# Patient Record
Sex: Female | Born: 1965 | Race: Black or African American | Hispanic: No | Marital: Single | Smoking: Never smoker
Health system: Southern US, Community
[De-identification: ages and names within clinical notes are randomized; demographics above are authoritative.]

## PROBLEM LIST (undated history)

## (undated) DIAGNOSIS — I2510 Atherosclerotic heart disease of native coronary artery without angina pectoris: Secondary | ICD-10-CM

## (undated) DIAGNOSIS — E7800 Pure hypercholesterolemia, unspecified: Secondary | ICD-10-CM

## (undated) DIAGNOSIS — H9190 Unspecified hearing loss, unspecified ear: Secondary | ICD-10-CM

## (undated) DIAGNOSIS — E079 Disorder of thyroid, unspecified: Secondary | ICD-10-CM

## (undated) DIAGNOSIS — M1990 Unspecified osteoarthritis, unspecified site: Secondary | ICD-10-CM

## (undated) DIAGNOSIS — K819 Cholecystitis, unspecified: Secondary | ICD-10-CM

## (undated) DIAGNOSIS — K2281 Esophageal polyp: Secondary | ICD-10-CM

## (undated) DIAGNOSIS — N83209 Unspecified ovarian cyst, unspecified side: Secondary | ICD-10-CM

## (undated) DIAGNOSIS — K228 Other specified diseases of esophagus: Secondary | ICD-10-CM

## (undated) DIAGNOSIS — C801 Malignant (primary) neoplasm, unspecified: Secondary | ICD-10-CM

## (undated) DIAGNOSIS — Q2730 Arteriovenous malformation, site unspecified: Secondary | ICD-10-CM

## (undated) DIAGNOSIS — D219 Benign neoplasm of connective and other soft tissue, unspecified: Secondary | ICD-10-CM

## (undated) DIAGNOSIS — F952 Tourette's disorder: Secondary | ICD-10-CM

## (undated) DIAGNOSIS — K219 Gastro-esophageal reflux disease without esophagitis: Secondary | ICD-10-CM

## (undated) DIAGNOSIS — I959 Hypotension, unspecified: Secondary | ICD-10-CM

## (undated) DIAGNOSIS — I498 Other specified cardiac arrhythmias: Secondary | ICD-10-CM

## (undated) DIAGNOSIS — H547 Unspecified visual loss: Secondary | ICD-10-CM

## (undated) HISTORY — PX: BREAST SURGERY: SHX581

---

## 2000-09-20 ENCOUNTER — Encounter: Payer: Self-pay | Admitting: Emergency Medicine

## 2000-09-20 ENCOUNTER — Emergency Department (HOSPITAL_COMMUNITY): Admission: EM | Admit: 2000-09-20 | Discharge: 2000-09-20 | Payer: Self-pay | Admitting: Emergency Medicine

## 2000-09-21 ENCOUNTER — Encounter: Payer: Self-pay | Admitting: Emergency Medicine

## 2000-09-21 ENCOUNTER — Emergency Department (HOSPITAL_COMMUNITY): Admission: EM | Admit: 2000-09-21 | Discharge: 2000-09-21 | Payer: Self-pay | Admitting: Emergency Medicine

## 2000-09-22 ENCOUNTER — Encounter: Payer: Self-pay | Admitting: Emergency Medicine

## 2001-06-01 ENCOUNTER — Other Ambulatory Visit: Admission: RE | Admit: 2001-06-01 | Discharge: 2001-06-01 | Payer: Self-pay | Admitting: Obstetrics and Gynecology

## 2002-02-12 ENCOUNTER — Emergency Department (HOSPITAL_COMMUNITY): Admission: EM | Admit: 2002-02-12 | Discharge: 2002-02-12 | Payer: Self-pay | Admitting: Emergency Medicine

## 2002-02-13 ENCOUNTER — Encounter: Payer: Self-pay | Admitting: Emergency Medicine

## 2002-04-30 ENCOUNTER — Emergency Department (HOSPITAL_COMMUNITY): Admission: EM | Admit: 2002-04-30 | Discharge: 2002-04-30 | Payer: Self-pay | Admitting: *Deleted

## 2002-06-15 ENCOUNTER — Other Ambulatory Visit: Admission: RE | Admit: 2002-06-15 | Discharge: 2002-06-15 | Payer: Self-pay | Admitting: Obstetrics and Gynecology

## 2003-06-10 ENCOUNTER — Encounter: Admission: RE | Admit: 2003-06-10 | Discharge: 2003-06-10 | Payer: Self-pay | Admitting: Obstetrics and Gynecology

## 2003-08-07 ENCOUNTER — Emergency Department (HOSPITAL_COMMUNITY): Admission: EM | Admit: 2003-08-07 | Discharge: 2003-08-07 | Payer: Self-pay | Admitting: Emergency Medicine

## 2003-11-22 ENCOUNTER — Other Ambulatory Visit: Admission: RE | Admit: 2003-11-22 | Discharge: 2003-11-22 | Payer: Self-pay | Admitting: Obstetrics and Gynecology

## 2004-05-01 ENCOUNTER — Emergency Department (HOSPITAL_COMMUNITY): Admission: EM | Admit: 2004-05-01 | Discharge: 2004-05-01 | Payer: Self-pay | Admitting: Emergency Medicine

## 2004-09-07 ENCOUNTER — Emergency Department (HOSPITAL_COMMUNITY): Admission: EM | Admit: 2004-09-07 | Discharge: 2004-09-07 | Payer: Self-pay | Admitting: Emergency Medicine

## 2005-01-08 ENCOUNTER — Other Ambulatory Visit: Admission: RE | Admit: 2005-01-08 | Discharge: 2005-01-08 | Payer: Self-pay | Admitting: Obstetrics and Gynecology

## 2005-06-29 ENCOUNTER — Emergency Department (HOSPITAL_COMMUNITY): Admission: EM | Admit: 2005-06-29 | Discharge: 2005-06-29 | Payer: Self-pay | Admitting: Emergency Medicine

## 2005-11-03 ENCOUNTER — Encounter: Admission: RE | Admit: 2005-11-03 | Discharge: 2005-11-03 | Payer: Self-pay | Admitting: Internal Medicine

## 2013-02-25 ENCOUNTER — Emergency Department (HOSPITAL_COMMUNITY): Payer: No Typology Code available for payment source

## 2013-02-25 ENCOUNTER — Encounter (HOSPITAL_COMMUNITY): Payer: Self-pay | Admitting: Emergency Medicine

## 2013-02-25 ENCOUNTER — Emergency Department (HOSPITAL_COMMUNITY)
Admission: EM | Admit: 2013-02-25 | Discharge: 2013-02-25 | Disposition: A | Discharge status: Home / Self Care | Payer: No Typology Code available for payment source | Attending: Emergency Medicine | Admitting: Emergency Medicine

## 2013-02-25 DIAGNOSIS — S161XXA Strain of muscle, fascia and tendon at neck level, initial encounter: Secondary | ICD-10-CM

## 2013-02-25 DIAGNOSIS — Z79899 Other long term (current) drug therapy: Secondary | ICD-10-CM | POA: Insufficient documentation

## 2013-02-25 DIAGNOSIS — Y9389 Activity, other specified: Secondary | ICD-10-CM | POA: Insufficient documentation

## 2013-02-25 DIAGNOSIS — Q279 Congenital malformation of peripheral vascular system, unspecified: Secondary | ICD-10-CM | POA: Insufficient documentation

## 2013-02-25 DIAGNOSIS — Z8742 Personal history of other diseases of the female genital tract: Secondary | ICD-10-CM | POA: Insufficient documentation

## 2013-02-25 DIAGNOSIS — Z8739 Personal history of other diseases of the musculoskeletal system and connective tissue: Secondary | ICD-10-CM | POA: Insufficient documentation

## 2013-02-25 DIAGNOSIS — Z8719 Personal history of other diseases of the digestive system: Secondary | ICD-10-CM | POA: Insufficient documentation

## 2013-02-25 DIAGNOSIS — Y92410 Unspecified street and highway as the place of occurrence of the external cause: Secondary | ICD-10-CM | POA: Insufficient documentation

## 2013-02-25 DIAGNOSIS — I2510 Atherosclerotic heart disease of native coronary artery without angina pectoris: Secondary | ICD-10-CM | POA: Insufficient documentation

## 2013-02-25 DIAGNOSIS — S139XXA Sprain of joints and ligaments of unspecified parts of neck, initial encounter: Secondary | ICD-10-CM | POA: Insufficient documentation

## 2013-02-25 DIAGNOSIS — E079 Disorder of thyroid, unspecified: Secondary | ICD-10-CM | POA: Insufficient documentation

## 2013-02-25 DIAGNOSIS — S0990XA Unspecified injury of head, initial encounter: Secondary | ICD-10-CM | POA: Insufficient documentation

## 2013-02-25 HISTORY — DX: Other specified cardiac arrhythmias: I49.8

## 2013-02-25 HISTORY — DX: Disorder of thyroid, unspecified: E07.9

## 2013-02-25 HISTORY — DX: Arteriovenous malformation, site unspecified: Q27.30

## 2013-02-25 HISTORY — DX: Unspecified ovarian cyst, unspecified side: N83.209

## 2013-02-25 HISTORY — DX: Other specified diseases of esophagus: K22.8

## 2013-02-25 HISTORY — DX: Benign neoplasm of connective and other soft tissue, unspecified: D21.9

## 2013-02-25 HISTORY — DX: Esophageal polyp: K22.81

## 2013-02-25 HISTORY — DX: Gastro-esophageal reflux disease without esophagitis: K21.9

## 2013-02-25 HISTORY — DX: Pure hypercholesterolemia, unspecified: E78.00

## 2013-02-25 HISTORY — DX: Hypotension, unspecified: I95.9

## 2013-02-25 HISTORY — DX: Unspecified osteoarthritis, unspecified site: M19.90

## 2013-02-25 HISTORY — DX: Atherosclerotic heart disease of native coronary artery without angina pectoris: I25.10

## 2013-02-25 MED ORDER — KETOROLAC TROMETHAMINE 60 MG/2ML IM SOLN
60.0000 mg | Freq: Once | INTRAMUSCULAR | Status: AC
  Administered 2013-02-25: 60 mg via INTRAMUSCULAR
  Filled 2013-02-25: qty 2

## 2013-02-25 MED ORDER — METHOCARBAMOL 500 MG PO TABS: 500.0000 mg | ORAL_TABLET | Freq: Two times a day (BID) | ORAL | Status: DC
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
## 2013-02-25 NOTE — ED Notes (Signed)
Called on xray to check delay
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
## 2013-02-27 NOTE — ED Provider Notes (Signed)
Medical screening examination/treatment/procedure(s) were performed by non-physician practitioner and as supervising physician I was immediately available for consultation/collaboration.  EKG Interpretation   None         Richardean Canal, MD 02/27/13 1444

## 2013-04-16 ENCOUNTER — Other Ambulatory Visit: Payer: Self-pay | Admitting: *Deleted

## 2013-04-16 DIAGNOSIS — M79609 Pain in unspecified limb: Secondary | ICD-10-CM

## 2013-04-18 ENCOUNTER — Ambulatory Visit
Admission: RE | Admit: 2013-04-18 | Discharge: 2013-04-18 | Disposition: A | Discharge status: Home / Self Care | Payer: Non-veteran care | Source: Ambulatory Visit | Attending: *Deleted | Admitting: *Deleted

## 2013-04-18 DIAGNOSIS — M79609 Pain in unspecified limb: Secondary | ICD-10-CM

## 2013-06-25 ENCOUNTER — Encounter (HOSPITAL_COMMUNITY): Payer: Self-pay | Admitting: Emergency Medicine

## 2013-06-25 ENCOUNTER — Emergency Department (HOSPITAL_COMMUNITY)
Admission: EM | Admit: 2013-06-25 | Discharge: 2013-06-25 | Discharge status: Against Medical Advice | Payer: Non-veteran care | Attending: Emergency Medicine | Admitting: Emergency Medicine

## 2013-06-25 DIAGNOSIS — Z8639 Personal history of other endocrine, nutritional and metabolic disease: Secondary | ICD-10-CM | POA: Insufficient documentation

## 2013-06-25 DIAGNOSIS — Y92009 Unspecified place in unspecified non-institutional (private) residence as the place of occurrence of the external cause: Secondary | ICD-10-CM | POA: Insufficient documentation

## 2013-06-25 DIAGNOSIS — I2510 Atherosclerotic heart disease of native coronary artery without angina pectoris: Secondary | ICD-10-CM | POA: Insufficient documentation

## 2013-06-25 DIAGNOSIS — S61409A Unspecified open wound of unspecified hand, initial encounter: Secondary | ICD-10-CM | POA: Insufficient documentation

## 2013-06-25 DIAGNOSIS — Y9389 Activity, other specified: Secondary | ICD-10-CM | POA: Insufficient documentation

## 2013-06-25 DIAGNOSIS — Z8719 Personal history of other diseases of the digestive system: Secondary | ICD-10-CM | POA: Insufficient documentation

## 2013-06-25 DIAGNOSIS — W5901XA Bitten by nonvenomous lizards, initial encounter: Secondary | ICD-10-CM | POA: Insufficient documentation

## 2013-06-25 DIAGNOSIS — Z79899 Other long term (current) drug therapy: Secondary | ICD-10-CM | POA: Insufficient documentation

## 2013-06-25 DIAGNOSIS — Z8739 Personal history of other diseases of the musculoskeletal system and connective tissue: Secondary | ICD-10-CM | POA: Insufficient documentation

## 2013-06-25 DIAGNOSIS — Z862 Personal history of diseases of the blood and blood-forming organs and certain disorders involving the immune mechanism: Secondary | ICD-10-CM | POA: Insufficient documentation

## 2013-06-25 DIAGNOSIS — W5911XA Bitten by nonvenomous snake, initial encounter: Secondary | ICD-10-CM | POA: Insufficient documentation

## 2013-06-25 DIAGNOSIS — Z8742 Personal history of other diseases of the female genital tract: Secondary | ICD-10-CM | POA: Insufficient documentation

## 2013-06-25 LAB — COMPREHENSIVE METABOLIC PANEL
ALT: 22 U/L (ref 0–35)
AST: 37 U/L (ref 0–37)
Albumin: 4 g/dL (ref 3.5–5.2)
Alkaline Phosphatase: 62 U/L (ref 39–117)
BILIRUBIN TOTAL: 0.4 mg/dL (ref 0.3–1.2)
BUN: 16 mg/dL (ref 6–23)
CALCIUM: 9.2 mg/dL (ref 8.4–10.5)
CO2: 25 mEq/L (ref 19–32)
CREATININE: 0.83 mg/dL (ref 0.50–1.10)
Chloride: 103 mEq/L (ref 96–112)
GFR, EST NON AFRICAN AMERICAN: 83 mL/min — AB (ref >90)
GLUCOSE: 80 mg/dL (ref 70–99)
Potassium: 3.9 mEq/L (ref 3.7–5.3)
SODIUM: 142 meq/L (ref 137–147)
Total Protein: 7.4 g/dL (ref 6.0–8.3)

## 2013-06-25 LAB — URINALYSIS, ROUTINE W REFLEX MICROSCOPIC
BILIRUBIN URINE: NEGATIVE
Glucose, UA: NEGATIVE mg/dL
HGB URINE DIPSTICK: NEGATIVE
KETONES UR: NEGATIVE mg/dL
LEUKOCYTES UA: NEGATIVE
Nitrite: NEGATIVE
PROTEIN: NEGATIVE mg/dL
SPECIFIC GRAVITY, URINE: 1.01 (ref 1.005–1.030)
UROBILINOGEN UA: 0.2 mg/dL (ref 0.0–1.0)
pH: 7 (ref 5.0–8.0)

## 2013-06-25 LAB — CBC WITH DIFFERENTIAL/PLATELET
Basophils Absolute: 0 10*3/uL (ref 0.0–0.1)
Basophils Relative: 1 % (ref 0–1)
Eosinophils Absolute: 0.1 10*3/uL (ref 0.0–0.7)
Eosinophils Relative: 2 % (ref 0–5)
HEMATOCRIT: 34.3 % — AB (ref 36.0–46.0)
Hemoglobin: 11.3 g/dL — ABNORMAL LOW (ref 12.0–15.0)
LYMPHS PCT: 33 % (ref 12–46)
Lymphs Abs: 1.5 10*3/uL (ref 0.7–4.0)
MCH: 33.2 pg (ref 26.0–34.0)
MCHC: 32.9 g/dL (ref 30.0–36.0)
MCV: 100.9 fL — AB (ref 78.0–100.0)
MONO ABS: 0.5 10*3/uL (ref 0.1–1.0)
MONOS PCT: 11 % (ref 3–12)
NEUTROS ABS: 2.5 10*3/uL (ref 1.7–7.7)
Neutrophils Relative %: 53 % (ref 43–77)
PLATELETS: 176 10*3/uL (ref 150–400)
RBC: 3.4 MIL/uL — AB (ref 3.87–5.11)
RDW: 12.8 % (ref 11.5–15.5)
WBC: 4.7 10*3/uL (ref 4.0–10.5)

## 2013-06-25 LAB — FIBRINOGEN: Fibrinogen: 225 mg/dL (ref 204–475)

## 2013-06-25 LAB — CK: Total CK: 1088 U/L — ABNORMAL HIGH (ref 7–177)

## 2013-06-25 LAB — PROTIME-INR
INR: 0.96 (ref 0.00–1.49)
PROTHROMBIN TIME: 12.6 s (ref 11.6–15.2)

## 2013-06-25 LAB — D-DIMER, QUANTITATIVE (NOT AT ARMC)

## 2013-06-25 MED ORDER — SODIUM CHLORIDE 0.9 % IV SOLN
Freq: Once | INTRAVENOUS | Status: AC
  Administered 2013-06-25: 18:00:00 via INTRAVENOUS

## 2013-06-25 MED ORDER — SODIUM CHLORIDE 0.9 % IV BOLUS (SEPSIS)
1000.0000 mL | Freq: Once | INTRAVENOUS | Status: AC
  Administered 2013-06-25: 1000 mL via INTRAVENOUS

## 2013-06-25 NOTE — ED Provider Notes (Signed)
CSN: [id number]     Arrival date & time 06/25/13  1410 History   First MD Initiated Contact with Patient 06/25/13 1546     Chief Complaint  Patient presents with  . Snake Bite     (Consider location/radiation/quality/duration/timing/severity/associated sxs/prior Treatment) HPI Comments: Patient states she was bitten by a small brown snake while working in her garden about 2 hours ago. She was wearing thick leather gloves. She felt a "nick" to the lower aspect of her right hand. She is not sure if there was a break in the glove. She denies any weakness, numbness, tingling, swelling or erythema. There is no obvious break in skin. She denies any chest pain or shortness of breath. Her tetanus is up-to-date.  The history is provided by the patient.    Past Medical History  Diagnosis Date  . Acid reflux   . Thyroid disease   . Coronary artery disease   . Bradyarrhythmia   . Arthritis   . Hypotension   . Arterio-venous malformation   . High cholesterol   . Fibroid tumor   . Ovarian cyst   . Polyp of esophagus    Past Surgical History  Procedure Laterality Date  . Breast surgery     No family history on file. History  Substance Use Topics  . Smoking status: Never Smoker   . Smokeless tobacco: Not on file  . Alcohol Use: No   OB History   Grav Para Term Preterm Abortions TAB SAB Ect Mult Living                 Review of Systems  Constitutional: Negative for fever, activity change and appetite change.  Respiratory: Negative for cough, chest tightness and shortness of breath.   Cardiovascular: Negative for chest pain.  Gastrointestinal: Negative for nausea, vomiting and abdominal pain.  Genitourinary: Negative for dysuria, hematuria, vaginal bleeding and vaginal discharge.  Musculoskeletal: Negative for arthralgias, back pain and myalgias.  Skin: Positive for wound. Negative for rash.  Neurological: Negative for dizziness, weakness and headaches.  A complete 10 system  review of systems was obtained and all systems are negative except as noted in the HPI and PMH.      Allergies  Egg yolk  Home Medications   Current Outpatient Rx  Name  Route  Sig  Dispense  Refill  . Levonorgestrel (MIRENA IU)   Intrauterine   by Intrauterine route.         Marland Kitchen OVER THE COUNTER MEDICATION   Oral   Take 0.5 tablets by mouth daily. OTC Thyroid Support          BP 102/63  Pulse 49  Temp(Src) 98.7 F (37.1 C) (Oral)  Resp 16  Ht 4\' 11"  (1.499 m)  Wt 118 lb (53.524 kg)  BMI 23.82 kg/m2  SpO2 99%  LMP 06/18/2013 Physical Exam  Constitutional: She is oriented to person, place, and time. She appears well-developed and well-nourished. No distress.  HENT:  Head: Normocephalic and atraumatic.  Mouth/Throat: Oropharynx is clear and moist. No oropharyngeal exudate.  Eyes: Conjunctivae and EOM are normal. Pupils are equal, round, and reactive to light.  Neck: Normal range of motion. Neck supple.  Cardiovascular: Normal rate, regular rhythm and normal heart sounds.   No murmur heard. Pulmonary/Chest: Effort normal and breath sounds normal. No respiratory distress.  Abdominal: Soft. There is no tenderness. There is no rebound and no guarding.  Musculoskeletal: Normal range of motion. She exhibits no edema and no tenderness.  Neurological: She is alert and oriented to person, place, and time. No cranial nerve deficit. She exhibits normal muscle tone. Coordination normal.  Skin: Skin is warm.  Patient indicates she was bitten on the ulnar aspect of her right hand through the glove. On examination of the skin there is no apparent break in the skin or puncture wound. No swelling or erythema. Intact distal pulses.    ED Course  Procedures (including critical care time) Labs Review Labs Reviewed  CBC WITH DIFFERENTIAL - Abnormal; Notable for the following:    RBC 3.40 (*)    Hemoglobin 11.3 (*)    HCT 34.3 (*)    MCV 100.9 (*)    All other components within  normal limits  COMPREHENSIVE METABOLIC PANEL - Abnormal; Notable for the following:    GFR calc non Af Amer 83 (*)    All other components within normal limits  CK - Abnormal; Notable for the following:    Total CK 1088 (*)    All other components within normal limits  PROTIME-INR  URINALYSIS, ROUTINE W REFLEX MICROSCOPIC  FIBRINOGEN  D-DIMER, QUANTITATIVE   Imaging Review No results found.   EKG Interpretation None      MDM   Final diagnoses:  Snake bite   Possible snake bite to ulnar right hand through glove about 2 hours ago.  No apparent break in the skin.  No swelling or erythema.  Poison center notified. Laboratory studies ordered. They recommend rechecking labs 6 hours after bite. Patient is not a candidate for antivenom. She has no significant swelling, pain or erythema. Her vitals are stable.  7:45 PM. Patient has had no change in the exam of her right arm. There is no erythema, swelling or pain or numbness. Arm measurements have been stable. See nursing notes.  Explained to the patient that per poison center recommendations we would obtain lab work at 7:45 PM which is 6 hours after her bite.  8:15 PM Informed by nursing staff that the patient had left the ED and pulled out her IV.  Do not suspect she had any envenomation. Her vitals remained stable. Her initial blood work was benign. Repeat blood work was not sent before she eloped. She did not notify staff before she left the ED.  Ezequiel Essex, MD 06/26/13 [phone]

## 2013-06-25 NOTE — ED Notes (Addendum)
Right wrist measured 6 inches, forearm 10 inches, upper arm 10 inches.
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
## 2013-06-25 NOTE — ED Notes (Signed)
Right wrist measured 6 inches, forearm 10 inches, upper arm 10 inches. 

## 2013-06-25 NOTE — ED Notes (Addendum)
Right wrist measured 6 inches, forearm 10 inches, upper arm 10 inches. 

## 2013-06-25 NOTE — ED Notes (Addendum)
Pt not found in the room, IV catheter found in the room, pt left without informing ED staff. Dr. Wyvonnia Dusky made aware.

## 2013-06-25 NOTE — ED Notes (Signed)
Family concerned about lab draw.  Contacted Phlebotomy for labs.

## 2013-06-25 NOTE — ED Notes (Signed)
Patient states was working outside and was bitten by snake.   Patient states the snake is brown with stripes.  Patient did bring snake in a plastic bag with her.  Patient was bitten on the R side of R hand.   Patient states her hand is sore and it feels sore up to her R shoulder.

## 2014-01-25 ENCOUNTER — Emergency Department (HOSPITAL_COMMUNITY): Payer: Non-veteran care

## 2014-01-25 ENCOUNTER — Encounter (HOSPITAL_COMMUNITY): Payer: Self-pay | Admitting: *Deleted

## 2014-01-25 ENCOUNTER — Emergency Department (HOSPITAL_COMMUNITY)
Admission: EM | Admit: 2014-01-25 | Discharge: 2014-01-25 | Disposition: A | Discharge status: Home / Self Care | Payer: Non-veteran care | Attending: Emergency Medicine | Admitting: Emergency Medicine

## 2014-01-25 DIAGNOSIS — Y998 Other external cause status: Secondary | ICD-10-CM | POA: Diagnosis not present

## 2014-01-25 DIAGNOSIS — Z8742 Personal history of other diseases of the female genital tract: Secondary | ICD-10-CM | POA: Insufficient documentation

## 2014-01-25 DIAGNOSIS — M25511 Pain in right shoulder: Secondary | ICD-10-CM

## 2014-01-25 DIAGNOSIS — Y9289 Other specified places as the place of occurrence of the external cause: Secondary | ICD-10-CM | POA: Diagnosis not present

## 2014-01-25 DIAGNOSIS — Z793 Long term (current) use of hormonal contraceptives: Secondary | ICD-10-CM | POA: Insufficient documentation

## 2014-01-25 DIAGNOSIS — I2510 Atherosclerotic heart disease of native coronary artery without angina pectoris: Secondary | ICD-10-CM | POA: Diagnosis not present

## 2014-01-25 DIAGNOSIS — S4991XA Unspecified injury of right shoulder and upper arm, initial encounter: Secondary | ICD-10-CM | POA: Insufficient documentation

## 2014-01-25 DIAGNOSIS — Z8739 Personal history of other diseases of the musculoskeletal system and connective tissue: Secondary | ICD-10-CM | POA: Diagnosis not present

## 2014-01-25 DIAGNOSIS — Z79899 Other long term (current) drug therapy: Secondary | ICD-10-CM | POA: Insufficient documentation

## 2014-01-25 DIAGNOSIS — X58XXXA Exposure to other specified factors, initial encounter: Secondary | ICD-10-CM | POA: Diagnosis not present

## 2014-01-25 DIAGNOSIS — Y9389 Activity, other specified: Secondary | ICD-10-CM | POA: Diagnosis not present

## 2014-01-25 DIAGNOSIS — E079 Disorder of thyroid, unspecified: Secondary | ICD-10-CM | POA: Insufficient documentation

## 2014-01-25 DIAGNOSIS — Z86018 Personal history of other benign neoplasm: Secondary | ICD-10-CM | POA: Diagnosis not present

## 2014-01-25 DIAGNOSIS — Z8719 Personal history of other diseases of the digestive system: Secondary | ICD-10-CM | POA: Insufficient documentation

## 2014-01-25 DIAGNOSIS — M25519 Pain in unspecified shoulder: Secondary | ICD-10-CM
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
## 2014-01-25 NOTE — Discharge Instructions (Signed)
Follow up with your orthopedist next week as you have planned. You can use ice or heat for comfort.

## 2014-01-25 NOTE — ED Notes (Signed)
Rt. Shoulder pain since 10/29/2013. Worse in the last week. Numbness from shoulder to rt. Thumb. Numbness intermittent. Pt. Is on wt. Restriction. Chronic rt leg pain (from hip to foot); lt. Leg (knee to foot)

## 2014-05-03 ENCOUNTER — Emergency Department (HOSPITAL_COMMUNITY)
Admission: EM | Admit: 2014-05-03 | Discharge: 2014-05-03 | Disposition: A | Discharge status: Home / Self Care | Payer: Non-veteran care | Attending: Emergency Medicine | Admitting: Emergency Medicine

## 2014-05-03 ENCOUNTER — Encounter (HOSPITAL_COMMUNITY): Payer: Self-pay

## 2014-05-03 DIAGNOSIS — Z793 Long term (current) use of hormonal contraceptives: Secondary | ICD-10-CM | POA: Diagnosis not present

## 2014-05-03 DIAGNOSIS — Z86018 Personal history of other benign neoplasm: Secondary | ICD-10-CM | POA: Diagnosis not present

## 2014-05-03 DIAGNOSIS — T148XXA Other injury of unspecified body region, initial encounter: Secondary | ICD-10-CM

## 2014-05-03 DIAGNOSIS — Z8742 Personal history of other diseases of the female genital tract: Secondary | ICD-10-CM | POA: Insufficient documentation

## 2014-05-03 DIAGNOSIS — Y9289 Other specified places as the place of occurrence of the external cause: Secondary | ICD-10-CM | POA: Diagnosis not present

## 2014-05-03 DIAGNOSIS — S60312A Abrasion of left thumb, initial encounter: Secondary | ICD-10-CM | POA: Diagnosis not present

## 2014-05-03 DIAGNOSIS — Z8739 Personal history of other diseases of the musculoskeletal system and connective tissue: Secondary | ICD-10-CM | POA: Insufficient documentation

## 2014-05-03 DIAGNOSIS — W451XXA Paper entering through skin, initial encounter: Secondary | ICD-10-CM | POA: Diagnosis not present

## 2014-05-03 DIAGNOSIS — Y998 Other external cause status: Secondary | ICD-10-CM | POA: Diagnosis not present

## 2014-05-03 DIAGNOSIS — Z8719 Personal history of other diseases of the digestive system: Secondary | ICD-10-CM | POA: Diagnosis not present

## 2014-05-03 DIAGNOSIS — E079 Disorder of thyroid, unspecified: Secondary | ICD-10-CM | POA: Insufficient documentation

## 2014-05-03 DIAGNOSIS — S60512A Abrasion of left hand, initial encounter: Secondary | ICD-10-CM | POA: Diagnosis present

## 2014-05-03 DIAGNOSIS — Q2730 Arteriovenous malformation, site unspecified: Secondary | ICD-10-CM | POA: Diagnosis not present

## 2014-05-03 DIAGNOSIS — I2510 Atherosclerotic heart disease of native coronary artery without angina pectoris: Secondary | ICD-10-CM | POA: Diagnosis not present

## 2014-05-03 DIAGNOSIS — Y9389 Activity, other specified: Secondary | ICD-10-CM | POA: Insufficient documentation

## 2014-05-03 NOTE — ED Notes (Signed)
Pt trying to make phone call to [place] "before I can talk to you".

## 2014-05-03 NOTE — ED Provider Notes (Signed)
CSN: [id number]     Arrival date & time 05/03/14  1327 History  This chart was scribed for Jamse Mead, PA-C, working with [employer]. Alvino Chapel, MD by Steva Colder, ED Scribe. The patient was seen in room TR05C/TR05C at 2:28 PM.    No chief complaint on file.    The history is provided by the patient. No language interpreter was used.    HPI Comments: Carol Dickson is a 49 y.o. female with PMHx of thyroid dx, CAD, bradyarrhythmia, arthritis, AVM, and high cholesterol who presents to the Emergency Department complaining of abrasion to the left hand onset yesterday from a paper cut. Pt has arthritis in both hands and was holding papers when her thumb gave out that resulted in a paper cut, pt noticed swelling today.  She states that she has tried warm water and soap to clean with relief for her symptoms. She denies loss of sensation, pus drainage, numbness, weakness, fever, and increased redness. Denies being diabetic. [facility]  [facility] History  Diagnosis Date  . Acid reflux   . Thyroid disease   . Coronary artery disease   . Bradyarrhythmia   . Arthritis   . Hypotension   . Arterio-venous malformation   . High cholesterol   . Fibroid tumor   . Ovarian cyst   . Polyp of esophagus    Past Surgical History  Procedure Laterality Date  . Breast surgery     History reviewed. No pertinent family history. History  Substance Use Topics  . Smoking status: Never Smoker   . Smokeless tobacco: Not on file  . Alcohol Use: No   OB History    No data available     Review of Systems  Constitutional: Negative for fever.  Skin: Positive for wound. Negative for color change.       Abrasion to the left thumb  Neurological: Negative for weakness and numbness.      Allergies  Egg yolk  Home Medications   Prior to Admission medications   Medication Sig Start Date End Date Taking? Authorizing Provider  Levonorgestrel (MIRENA IU) by Intrauterine route.    Historical Provider, MD   OVER THE COUNTER MEDICATION Take 0.5 tablets by mouth daily. OTC Thyroid Support    Historical Provider, MD   BP 119/75 mmHg  Pulse 61  Temp(Src) 97.6 F (36.4 C) (Oral)  Resp 18  Ht 4\' 11"  (1.499 m)  Wt 122 lb (55.339 kg)  BMI 24.63 kg/m2  SpO2 100%  LMP 04/11/2014  Physical Exam  Constitutional: She is oriented to person, place, and time. She appears well-developed and well-nourished. No distress.  HENT:  Head: Normocephalic and atraumatic.  Eyes: Conjunctivae and EOM are normal. Right eye exhibits no discharge. Left eye exhibits no discharge.  Neck: Normal range of motion. Neck supple.  Cardiovascular: Normal rate, regular rhythm and normal heart sounds.   Pulses:      Radial pulses are 2+ on the right side, and 2+ on the left side.  Cap refill < 3 seconds  Pulmonary/Chest: Effort normal and breath sounds normal. No respiratory distress. She has no wheezes. She has no rales.  Musculoskeletal: Normal range of motion.  Full ROM to upper and lower extremities without difficulty noted, negative ataxia noted.  Neurological: She is alert and oriented to person, place, and time.  Sensation intact Strength intact to MCP, PIP, DIP joints of left hand  Skin: Skin is warm and dry. No rash noted. She is not diaphoretic.  No erythema.  Approximately 1 mm superficial abrasion identified to the base of the left thumb. Negative active drainage or bleeding. Negative swelling. Negative signs of cellulitic infection. Negative red streaks.  Psychiatric: She has a normal mood and affect. Her behavior is normal. Thought content normal.  Nursing note and vitals reviewed.   ED Course  Procedures (including critical care time) DIAGNOSTIC STUDIES: Oxygen Saturation is 100% on room air, normal by my interpretation.    COORDINATION OF CARE: 2:31 PM-Discussed treatment plan which includes warm water, soap, bacitracin, f/u if the symptoms worsen with pt at bedside and pt agreed to plan.   Labs  Review Labs Reviewed - No data to display  Imaging Review No results found.   EKG Interpretation None      MDM   Final diagnoses:  Abrasion    Medications - No data to display  Filed Vitals:   05/03/14 1331  BP: 119/75  Pulse: 61  Temp: 97.6 F (36.4 C)  TempSrc: Oral  Resp: 18  Height: 4\' 11"  (1.499 m)  Weight: 122 lb (55.339 kg)  SpO2: 100%   I personally performed the services described in this documentation, which was scribed in my presence. The recorded information has been reviewed and is accurate.   Patient presenting to the ED with superficial abrasion identified to the base of the left thumb with negative active drainage, bleeding, cellulitic infection. Wound healing well. Patient reported that she wanted to check to make sure there is no infection. Full range of motion. Pulses palpable and strong. Sensation intact. Negative focal neurological deficits noted. Patient stable, afebrile. Patient not septic appearing. Discharged patient. Referred patient to PCP. Discussed with patient to properly wash and apply antibiotic ointment. Discussed with patient to closely monitor symptoms and if symptoms are to worsen or change to report back to the ED - strict return instructions given.  Patient agreed to plan of care, understood, all questions answered.   Jamse Mead, PA-C 05/03/14 [place] Alvino Chapel, MD 05/07/14 [phone]

## 2014-05-03 NOTE — Discharge Instructions (Signed)
Please call your doctor for a followup appointment within 24-48 hours. When you talk to your doctor please let them know that you were seen in the emergency department and have them acquire all of your records so that they can discuss the findings with you and formulate a treatment plan to fully care for your new and ongoing problems. Please keep site clean with warm order and soap Please apply antibiotic ointment Please continue to monitor symptoms closely and if symptoms are to worsen or change (fever greater than 101, chills, sweating, nausea, vomiting, chest pain, shortness of breathe, difficulty breathing, weakness, numbness, tingling, worsening or changes to pain pattern, Swelling, red streaks, pus drainage, swelling to the thumb, fall, injury) please report back to the Emergency Department immediately.    Abrasion An abrasion is a cut or scrape of the skin. Abrasions do not extend through all layers of the skin and most heal within 10 days. It is important to care for your abrasion properly to prevent infection. CAUSES  Most abrasions are caused by falling on, or gliding across, the ground or other surface. When your skin rubs on something, the outer and inner layer of skin rubs off, causing an abrasion. DIAGNOSIS  Your caregiver will be able to diagnose an abrasion during a physical exam.  TREATMENT  Your treatment depends on how large and deep the abrasion is. Generally, your abrasion will be cleaned with water and a mild soap to remove any dirt or debris. An antibiotic ointment may be put over the abrasion to prevent an infection. A bandage (dressing) may be wrapped around the abrasion to keep it from getting dirty.  You may need a tetanus shot if:  You cannot remember when you had your last tetanus shot.  You have never had a tetanus shot.  The injury broke your skin. If you get a tetanus shot, your arm may swell, get red, and feel warm to the touch. This is common and not a problem.  If you need a tetanus shot and you choose not to have one, there is a rare chance of getting tetanus. Sickness from tetanus can be serious.  HOME CARE INSTRUCTIONS   If a dressing was applied, change it at least once a day or as directed by your caregiver. If the bandage sticks, soak it off with warm water.   Wash the area with water and a mild soap to remove all the ointment 2 times a day. Rinse off the soap and pat the area dry with a clean towel.   Reapply any ointment as directed by your caregiver. This will help prevent infection and keep the bandage from sticking. Use gauze over the wound and under the dressing to help keep the bandage from sticking.   Change your dressing right away if it becomes wet or dirty.   Only take over-the-counter or prescription medicines for pain, discomfort, or fever as directed by your caregiver.   Follow up with your caregiver within 24-48 hours for a wound check, or as directed. If you were not given a wound-check appointment, look closely at your abrasion for redness, swelling, or pus. These are signs of infection. SEEK IMMEDIATE MEDICAL CARE IF:   You have increasing pain in the wound.   You have redness, swelling, or tenderness around the wound.   You have pus coming from the wound.   You have a fever or persistent symptoms for more than 2-3 days.  You have a fever and your symptoms suddenly  get worse.  You have a bad smell coming from the wound or dressing.  MAKE SURE YOU:   Understand these instructions.  Will watch your condition.  Will get help right away if you are not doing well or get worse. Document Released: 12/09/2004 Document Revised: 02/16/2012 Document Reviewed: 02/02/2011 [facility] Patient Information 2015 [place], [place]. This information is not intended to replace advice given to you by your health care provider. Make sure you discuss any questions you have with your health care provider.   Emergency Department  Resource Guide 1) Find a Doctor and Pay Out of Pocket Although you won't have to find out who is covered by your insurance plan, it is a good idea to ask around and get recommendations. You will then need to call the office and see if the doctor you have chosen will accept you as a new patient and what types of options they offer for patients who are self-pay. Some doctors offer discounts or will set up payment plans for their patients who do not have insurance, but you will need to ask so you aren't surprised when you get to your appointment.  2) Contact Your Local Health Department Not all health departments have doctors that can see patients for sick visits, but many do, so it is worth a call to see if yours does. If you don't know where your local health department is, you can check in your phone book. The CDC also has a tool to help you locate your state's health department, and many state websites also have listings of all of their local health departments.  3) Find a [facility] If your illness is not likely to be very severe or complicated, you may want to try a walk in clinic. These are popping up all over the country in pharmacies, drugstores, and shopping centers. They're usually staffed by nurse practitioners or physician assistants that have been trained to treat common illnesses and complaints. They're usually fairly quick and inexpensive. However, if you have serious medical issues or chronic medical problems, these are probably not your best option.  No Primary Care Doctor: - Call Health Connect at  [phone] - they can help you locate a primary care doctor that  accepts your insurance, provides certain services, etc. - Physician Referral Service- [phone]  Chronic Pain Problems: Organization         Address  Phone   Notes  [facility]  [phone] Patients need to be referred by their primary care doctor.   Medication Assistance: Organization          Address  Phone   Notes  [facility] Medication [facility] [address] [place], [place] [address] [phone] --Must be a resident of [facility] -- Must have NO insurance coverage whatsoever (no Medicaid/ Medicare, etc.) -- The pt. MUST have a primary care doctor that directs their care regularly and follows them in the community   [facility]ist  [phone]   [employer]  [phone]    Agencies that provide inexpensive medical care: Organization         Address  Phone   Notes  [place]  [phone]   [facility]    [phone]   [facility] [place], [place] [address] [phone]   [place] [address], [place] [phone]   Planned Parenthood    (  [phone]   [facility]    [phone]   [facility]  [address], [place] Phone:  [phone], Fax:  [phone] Hours of Operation:  9 am - 6 pm, M-F.  Also accepts Medicaid/Medicare and self-pay.  [facility] for [place], [address], [place] Phone: [phone], Fax: [phone]. Hours of Operation:  8:30 am - 5:30 pm, M-F.  Also accepts Medicaid and self-pay.  [facility] [place] [address] [place] Phone: [phone]   [place], [place], [place] [phone], Ext. [phone] Mondays & Thursdays: 7-9 AM.  First 15 patients are seen on a first come, first serve basis.    [place] Providers:  Organization         Address  Phone   Notes  [facility] [address], [place] [phone] Also accepts self-pay patients.  [facility] [address], [place]  [phone]   [place], [address], [place] [phone]   [facility] [address], [place] [phone]   Lucianne Lei [address], [place]   [phone] Only accepts [place] Access [place] patients after they have their name applied to their card.   Self-Pay (no insurance) in [facility]:  Organization         Address  Phone   Notes  Sickle Cell Patients, [facility] Internal Medicine [place] [phone]   [facility] [place] [phone]   [facility] [place]  [place], [place], [place] [phone]   [facility]/Dr. Osei-Bonsu  [address], [place] or [address], [place] [phone] Phone number for both [place] and [place] locations is the same.  [facility] [address], [place] [phone]   [facility] [address], [place] or [address] [phone]   [facility] [address], [place] [phone], phone; [phone], fax Sees patients 1st and 3rd Saturday of every month.  Must not qualify for public or private insurance (i.e. Medicaid, Medicare, [place] Health Choice, Veterans' Benefits)  Household income should be no more than 200% of the poverty level The clinic cannot treat you if you are pregnant or think you are pregnant  Sexually transmitted diseases are not treated at the clinic.    Dental Care: Organization         Address  Phone  Notes  [facility] Department of [facility] [place] [phone] Accepts children up to age 66 who are enrolled in [place] or [place]; pregnant women with a Medicaid card; and children who have applied for Medicaid or [place] Health Choice, but were declined, whose parents can pay a reduced fee at time of service.  [facility] Department of [facility]  [address], [place] [phone] Accepts children up to age 83 who are enrolled in [place] or [place]; pregnant women with a Medicaid card; and children who have applied for Medicaid or [place], but were declined, whose parents can pay a  reduced fee at time of service.  [place] Adult Dental Access PROGRAM  [place] [phone] Patients are seen by appointment only. Walk-ins are not accepted. [place] will see patients 80 years of age and older. Monday - Tuesday (8am-5pm) Most Wednesdays (8:30-5pm) $30 per visit, cash only  [facility] Adult Dental Access PROGRAM  [address], [facility] [phone] Patients are seen by appointment only. Walk-ins are not accepted. [place] will see patients 72 years of age and older. One Wednesday Evening (Monthly: Volunteer Based).  $30 per visit, cash only  [place]  [phone] for adults; Children under age 85, call Graduate Pediatric Dentistry at [phone]. Children aged 41-14, please call [phone] to request a pediatric application.  Dental services are provided in all areas of dental care including fillings, crowns and bridges, complete and partial dentures, implants, gum treatment, root canals, and extractions. Preventive care is also provided. Treatment is provided to both adults and children. Patients are selected via a lottery and there is often a waiting list.   [facility] [address] [place]  [phone] [email or web address]   [facility] [address] [place], [place] [phone], Ext. [phone] Second and Fourth Thursday of each month, opens at 6:30 AM; Clinic ends at 9 AM.  Patients are seen on a first-come first-served basis, and a limited number are seen during each clinic.   [facility]  [address] Hillard Danker [place], [place] [phone]   Eligibility Requirements You must  have lived in [place], [place], or [place] counties for at least the last three months.   You cannot be eligible for state or federal sponsored [employer], including [employer], [place], or [employer].   You generally cannot be eligible for healthcare insurance through your employer.    How to apply: Eligibility screenings are held every Tuesday and Wednesday afternoon from 1:00 pm until 4:00 pm. You do not need an appointment for the interview!  [facility] [address], [employer] [employer], [place]   [place]  [place] Department  [place]  [phone]    Behavioral Health Resources in the Community: Intensive Outpatient Programs Organization         Address  Phone  Notes  [place]. [address] [place], [place] [phone]   [facility] Outpatient [address], [place], [place]   ADS: Alcohol & Drug Svcs [address] [place], [place]   [place] [address],  [place], [place] or [phone]   Substance Abuse Resources Organization         Address  Phone  Notes  Alcohol and Drug Services  [phone]   [place]  [phone]   [facility] [place]   Chinita Pester  [phone]   Residential & Outpatient Substance Abuse Program  [phone]   Psychological Services Organization         Address  Phone  Notes  [facility] [place]  [place]  [phone]   [place] [address] [place] or [phone]    Mobile Crisis Teams Organization         Address  Phone  Notes  [facility], Mobile Crisis Care Unit  [phone]  [facility]ive Psychotherapeutic Services  [address] [place], [place]   [facility] [address] [place] [phone]    Self-Help/Support Groups Organization         Address  Phone             Notes  [facility] [place] - variety of support groups  [place] Call for more information  Narcotics Anonymous (NA), [facility] [address], [employer] [place]  2 meetings at this location   Special educational needs teacher         Address  Phone  Notes  ASAP Residential Treatment [place],    [place]  [phone]   [facility]  [address], [place] [phone], [place], [place]   [place], [place] [phone] Admissions: 8am-3pm M-F  Incentives Substance [place] [address]    [place], [place] [phone]   [facility] [address] [place], [place], [place]   [facility] [address]  [place], [place]   [facility] - Intensive Outpatient [address], [place], [place]   [facility] ([place].,  [place], [place] [phone] or [phone]   [facility]idential Treatment Services (RTS) [address] [place], [place] Accepts Medicaid  [facility] [place] [address],  [place] [phone] Substance Abuse/Addiction Treatment   [facility] Organization         Address  Phone  Notes  [facility]  [phone]   Domenic Schwab, PhD [address] Arlis Porta [place], [place]   [phone] or [phone]   [place], [place] [phone]   [facility] [address], [place], [place] [phone] Insurance/Medicaid/sponsorship through [facility] [address]                                    [place], [place] [phone] [place] [address], [place] [phone]    Dr. Adele Schilder  [phone]   [facility] Dept. 1) [address], [place] 2) [place] 3)  [address], [place] [phone]  [phone]   [place] [phone] or [phone] (After Hours)

## 2014-05-03 NOTE — ED Notes (Signed)
Pt presents with very small abrasion to L hand from paper cut yesterday.  Pt reports redness and swelling to area (minimal noted in triage) today, concerned for infection.

## 2014-05-23 ENCOUNTER — Encounter (HOSPITAL_COMMUNITY): Payer: Self-pay

## 2014-05-23 ENCOUNTER — Emergency Department (HOSPITAL_COMMUNITY)
Admission: EM | Admit: 2014-05-23 | Discharge: 2014-05-23 | Disposition: A | Discharge status: Home / Self Care | Payer: Non-veteran care | Attending: Emergency Medicine | Admitting: Emergency Medicine

## 2014-05-23 DIAGNOSIS — Y998 Other external cause status: Secondary | ICD-10-CM | POA: Insufficient documentation

## 2014-05-23 DIAGNOSIS — Z8742 Personal history of other diseases of the female genital tract: Secondary | ICD-10-CM | POA: Diagnosis not present

## 2014-05-23 DIAGNOSIS — X58XXXA Exposure to other specified factors, initial encounter: Secondary | ICD-10-CM | POA: Diagnosis not present

## 2014-05-23 DIAGNOSIS — M25531 Pain in right wrist: Secondary | ICD-10-CM

## 2014-05-23 DIAGNOSIS — S6991XA Unspecified injury of right wrist, hand and finger(s), initial encounter: Secondary | ICD-10-CM | POA: Insufficient documentation

## 2014-05-23 DIAGNOSIS — Z86018 Personal history of other benign neoplasm: Secondary | ICD-10-CM | POA: Diagnosis not present

## 2014-05-23 DIAGNOSIS — Y9389 Activity, other specified: Secondary | ICD-10-CM | POA: Diagnosis not present

## 2014-05-23 DIAGNOSIS — Q2730 Arteriovenous malformation, site unspecified: Secondary | ICD-10-CM | POA: Insufficient documentation

## 2014-05-23 DIAGNOSIS — H1132 Conjunctival hemorrhage, left eye: Secondary | ICD-10-CM | POA: Insufficient documentation

## 2014-05-23 DIAGNOSIS — Z8739 Personal history of other diseases of the musculoskeletal system and connective tissue: Secondary | ICD-10-CM | POA: Diagnosis not present

## 2014-05-23 DIAGNOSIS — Z793 Long term (current) use of hormonal contraceptives: Secondary | ICD-10-CM | POA: Diagnosis not present

## 2014-05-23 DIAGNOSIS — Z8719 Personal history of other diseases of the digestive system: Secondary | ICD-10-CM | POA: Diagnosis not present

## 2014-05-23 DIAGNOSIS — Y92007 Garden or yard of unspecified non-institutional (private) residence as the place of occurrence of the external cause: Secondary | ICD-10-CM | POA: Insufficient documentation

## 2014-05-23 DIAGNOSIS — Z862 Personal history of diseases of the blood and blood-forming organs and certain disorders involving the immune mechanism: Secondary | ICD-10-CM | POA: Insufficient documentation

## 2014-05-23 DIAGNOSIS — I2510 Atherosclerotic heart disease of native coronary artery without angina pectoris: Secondary | ICD-10-CM | POA: Insufficient documentation

## 2014-05-23 DIAGNOSIS — H5442 Blindness, left eye, normal vision right eye: Secondary | ICD-10-CM | POA: Insufficient documentation

## 2014-05-23 DIAGNOSIS — E079 Disorder of thyroid, unspecified: Secondary | ICD-10-CM | POA: Insufficient documentation

## 2014-05-23 DIAGNOSIS — Z8589 Personal history of malignant neoplasm of other organs and systems: Secondary | ICD-10-CM | POA: Insufficient documentation

## 2014-05-23 HISTORY — DX: Cholecystitis, unspecified: K81.9

## 2014-05-23 HISTORY — DX: Malignant (primary) neoplasm, unspecified: C80.1

## 2014-05-23 HISTORY — DX: Unspecified visual loss: H54.7

## 2014-05-23 NOTE — ED Notes (Signed)
Pt reports injuring right arm yesterday.  Sts she was told in January that she may have a torn ligament by Dr. Tonita Cong.  Sts she was doing yard work yesterday and felt a pop and noticed swelling to right hand and right forearm.  Sts she is suppose to have a MRI on March 14th.  Pt also reports busted blood vessel in left eye due to straining.

## 2014-05-23 NOTE — Discharge Instructions (Signed)
Wrist Pain Wrist injuries are frequent in adults and children. A sprain is an injury to the ligaments that hold your bones together. A strain is an injury to muscle or muscle cord-like structures (tendons) from stretching or pulling. Generally, when wrists are moderately tender to touch following a fall or injury, a break in the bone (fracture) may be present. Most wrist sprains or strains are better in 3 to 5 days, but complete healing may take several weeks. HOME CARE INSTRUCTIONS   Put ice on the injured area.  Put ice in a plastic bag.  Place a towel between your skin and the bag.  Leave the ice on for 15-20 minutes, 3-4 times a day, for the first 2 days, or as directed by your health care provider.  Keep your arm raised above the level of your heart whenever possible to reduce swelling and pain.  Rest the injured area for at least 48 hours or as directed by your health care provider.  If a splint or elastic bandage has been applied, use it for as long as directed by your health care provider or until seen by a health care provider for a follow-up exam.  Only take over-the-counter or prescription medicines for pain, discomfort, or fever as directed by your health care provider.  Keep all follow-up appointments. You may need to follow up with a specialist or have follow-up X-rays. Improvement in pain level is not a guarantee that you did not fracture a bone in your wrist. The only way to determine whether or not you have a broken bone is by X-ray. SEEK IMMEDIATE MEDICAL CARE IF:   Your fingers are swollen, very red, white, or cold and blue.  Your fingers are numb or tingling.  You have increasing pain.  You have difficulty moving your fingers. MAKE SURE YOU:   Understand these instructions.  Will watch your condition.  Will get help right away if you are not doing well or get worse. Document Released: 12/09/2004 Document Revised: 03/06/2013 Document Reviewed:  04/22/2010 [facility] Patient Information 2015 [place], [place]. This information is not intended to replace advice given to you by your health care provider. Make sure you discuss any questions you have with your health care provider.  Subconjunctival Hemorrhage A subconjunctival hemorrhage is a bright red patch covering a portion of the white of the eye. The white part of the eye is called the sclera, and it is covered by a thin membrane called the conjunctiva. This membrane is clear, except for tiny blood vessels that you can see with the naked eye. When your eye is irritated or inflamed and becomes red, it is because the vessels in the conjunctiva are swollen. Sometimes, a blood vessel in the conjunctiva can break and bleed. When this occurs, the blood builds up between the conjunctiva and the sclera, and spreads out to create a red area. The red spot may be very small at first. It may then spread to cover a larger part of the surface of the eye, or even all of the visible white part of the eye. In almost all cases, the blood will go away and the eye will become white again. Before completely dissolving, however, the red area may spread. It may also become brownish-yellow in color before going away. If a lot of blood collects under the conjunctiva, it may look like a bulge on the surface of the eye. This looks scary, but it will also eventually flatten out and go away. Subconjunctival hemorrhages  do not cause pain, but if swollen, may cause a feeling of irritation. There is no effect on vision.  CAUSES   The most common cause is mild trauma (rubbing the eye, irritation).  Subconjunctival hemorrhages can happen because of coughing or straining (lifting heavy objects), vomiting, or sneezing.  In some cases, your doctor may want to check your blood pressure. High blood pressure can also cause a subconjunctival hemorrhage.  Severe trauma or blunt injuries.  Diseases that affect blood clotting  (hemophilia, leukemia).  Abnormalities of blood vessels behind the eye (carotid cavernous sinus fistula).  Tumors behind the eye.  Certain drugs (aspirin, Coumadin, heparin).  Recent eye surgery. HOME CARE INSTRUCTIONS   Do not worry about the appearance of your eye. You may continue your usual activities.  Often, follow-up is not necessary. SEEK MEDICAL CARE IF:   Your eye becomes painful.  The bleeding does not disappear within 3 weeks.  Bleeding occurs elsewhere, for example, under the skin, in the mouth, or in the other eye.  You have recurring subconjunctival hemorrhages. SEEK IMMEDIATE MEDICAL CARE IF:   Your vision changes or you have difficulty seeing.  You develop a severe headache, persistent vomiting, confusion, or abnormal drowsiness (lethargy).  Your eye seems to bulge or protrude from the eye socket.  You notice the sudden appearance of bruises or have spontaneous bleeding elsewhere on your body. Document Released: 03/01/2005 Document Revised: 07/16/2013 Document Reviewed: 01/27/2009 [facility] Patient Information 2015 [place], [place]. This information is not intended to replace advice given to you by your health care provider. Make sure you discuss any questions you have with your health care provider.   Emergency Department Resource Guide 1) Find a Doctor and Pay Out of Pocket Although you won't have to find out who is covered by your insurance plan, it is a good idea to ask around and get recommendations. You will then need to call the office and see if the doctor you have chosen will accept you as a new patient and what types of options they offer for patients who are self-pay. Some doctors offer discounts or will set up payment plans for their patients who do not have insurance, but you will need to ask so you aren't surprised when you get to your appointment.  2) Contact Your Local Health Department Not all health departments have doctors that can see  patients for sick visits, but many do, so it is worth a call to see if yours does. If you don't know where your local health department is, you can check in your phone book. The CDC also has a tool to help you locate your state's health department, and many state websites also have listings of all of their local health departments.  3) Find a [facility] If your illness is not likely to be very severe or complicated, you may want to try a walk in clinic. These are popping up all over the country in pharmacies, drugstores, and shopping centers. They're usually staffed by nurse practitioners or physician assistants that have been trained to treat common illnesses and complaints. They're usually fairly quick and inexpensive. However, if you have serious medical issues or chronic medical problems, these are probably not your best option.  No Primary Care Doctor: - Call Health Connect at  [phone] - they can help you locate a primary care doctor that  accepts your insurance, provides certain services, etc. - Physician Referral Service- [phone]  Chronic Pain Problems: Organization  Address  Phone   Notes  [facility]  [phone] Patients need to be referred by their primary care doctor.   Medication Assistance: Organization         Address  Phone   Notes  [facility] Medication [facility] [address] [place], [place] [address] [phone] --Must be a resident of [facility] -- Must have NO insurance coverage whatsoever (no Medicaid/ Medicare, etc.) -- The pt. MUST have a primary care doctor that directs their care regularly and follows them in the community   MedAssist  [phone]   [employer]  [phone]    Agencies that provide inexpensive medical care: Organization         Address  Phone   Notes  [place]  [phone]   [facility]    [phone]   [facility] [place], [place] [address] [phone]   [place] [address] [place] [phone]   Planned Parenthood    [phone]   [facility]    [phone]   [place] [address], [place] Phone:  [phone], Fax:  [phone] Hours of Operation:  9 am - 6 pm, M-F.  Also accepts Medicaid/Medicare and self-pay.  [facility] for [place] [address] [place], [address], [place] Phone: [phone], Fax: [phone]. Hours of Operation:  8:30 am - 5:30 pm, M-F.  Also accepts Medicaid and self-pay.  [facility] [place] [address], [place] Phone: [phone]   [place], [place], [place] [phone], Ext. [phone] Mondays & Thursdays: 7-9 AM.  First 15 patients are seen on a first come, first serve basis.    [place] Providers:  Organization         Address  Phone   Notes  [facility] [address], [place] [phone] Also accepts self-pay patients.  [facility] [address], [place]  [phone]   [place], [address], [place] [phone]   [facility] [address] [place] [phone]   Lucianne Lei [address], [place]   [phone] Only accepts [place] Access [place] patients after they have their name applied to their card.   Self-Pay (no insurance) in [facility]:  Organization         Address  Phone   Notes  Sickle Cell Patients, [facility] [place] [phone]   [facility] Urgent Care [place] [phone]   [facility] [place]  [place], [place], [place] [phone]   [facility]/Dr. Osei-Bonsu   [address], [place] or [address], [place] [phone] Phone number for both [place] and [place] locations is the same.  Urgent Medical and [facility] [address], [place] [phone]   [facility] [address] [place] or [address] [phone]   [facility] [address] [place] [phone], phone; [phone], fax Sees patients 1st and 3rd Saturday of every month.  Must not qualify  for public or private insurance (i.e. Medicaid, Medicare, [place] Health Choice, Veterans' Benefits)  Household income should be no more than 200% of the poverty level The clinic cannot treat you if you are pregnant or think you are pregnant  Sexually transmitted diseases are not treated at the clinic.    Dental Care: Organization         Address  Phone  Notes  [facility] Department of [facility] [facility] [place] [phone] Accepts children up to age 70 who are enrolled in [place] or [place]; pregnant women with a Medicaid card; and children who have applied for Medicaid or [place] Health Choice, but were declined, whose parents can pay a reduced fee at time of service.  [place] Department of [facility]  [address], [place] [phone] Accepts children up to age 71 who are enrolled in [place] or [place]; pregnant women with a Medicaid card; and children who have applied for Medicaid or [place] Health Choice, but were declined, whose parents can pay a reduced fee at time of service.  [facility] Adult [facility] Access PROGRAM  [place] [phone] Patients are seen by appointment only. Walk-ins are not accepted. [place] will see patients 23 years of age and older. Monday - Tuesday (8am-5pm) Most Wednesdays (8:30-5pm) $30 per visit, cash only  [facility] Adult Dental Access  PROGRAM  [address], [facility] [phone] Patients are seen by appointment only. Walk-ins are not accepted. [place] will see patients 75 years of age and older. One Wednesday Evening (Monthly: Volunteer Based).  $30 per visit, cash only  [place]  [phone] for adults; Children under age 46, call Graduate Pediatric Dentistry at [phone]. Children aged 15-14, please call [phone] to request a pediatric application.  Dental services are provided in all areas of dental care including fillings, crowns and bridges, complete and partial dentures, implants, gum treatment, root canals, and extractions. Preventive care is also provided. Treatment is provided to both adults and children. Patients are selected via a lottery and there is often a waiting list.   [facility] [address], [place]  [phone] [email or web address]   [facility] [address] [place], [place] [phone], Ext. [phone] Second and Fourth Thursday of each month, opens at 6:30 AM; Clinic ends at 9 AM.  Patients are seen on a first-come first-served basis, and a limited number are seen during each clinic.   [facility]  [address] Hillard Danker [place], [place] [phone]   Eligibility Requirements You must have lived in [place], [place], or [place] counties for at least the last three months.   You cannot be eligible for state or federal sponsored [employer], including [employer], [place], or [employer].   You generally cannot be eligible for healthcare insurance through your employer.    How to apply: Eligibility screenings are held every Tuesday and Wednesday afternoon from 1:00 pm until 4:00 pm. You do not need an appointment for the interview!  [facility] [address] [place], [place]   [place]  [place] Department   [place]  [phone]    Behavioral Health Resources in the Community: Intensive Outpatient Programs Organization         Address  Phone  Notes  [facility] [address], [place], [place] [phone]   [facility] [address] [place], [place]   ADS: Alcohol & Drug Svcs [address] [place], [place]   [place] [address],  [place], [place] or [phone]   Substance Abuse Resources Organization         Address  Phone  Notes  Alcohol and Drug Services  [phone]   [place]  [phone]   [facility] [place]   Chinita Pester  [phone]   Residential & Outpatient Substance Abuse Program  [phone]   Psychological Services Organization         Address  Phone  Notes  [facility] [place]  [place]  [phone]   [place] [address], [place] or [phone]    Mobile Crisis Teams Organization         Address  Phone  Notes  [facility], Mobile Crisis Care Unit  [phone]   [facility]otherapeutic [facility]  [address] [place], [place]   [place] [address] [place] [phone]    Self-Help/Support Groups Organization         Address  Phone             Notes  [place]. of [place] - variety of support groups  [place] Call for more information  Narcotics Anonymous (NA), [facility]aring [facility] [address], [employer] [place]  2 meetings at this location   Special educational needs teacher         Address  Phone  Notes  [facility] Residential Treatment [place],    [place]  [phone]   [facility]  [address], [place] [phone], [place], [place]   [place], [place] [phone] Admissions: 8am-3pm M-F  Incentives Substance [place] [address],    [place], [place] [phone]   [facility] [address] [place], [place], [place]   [facility] [address]  [place], [place]   [facility] Programs - Intensive Outpatient [address], [place], [place]   [facility] ([place].) [place].,  [place], [place] [phone] or [phone]   [facility]idential Treatment Services (RTS) [address] [place], [place] Accepts Medicaid  [facility] [place] [address],  [place] [phone] Substance Abuse/Addiction Treatment   [facility] Organization         Address  Phone  Notes  [facility]  [phone]   Domenic Schwab, PhD [address] [place]lis [place], [place]   [phone] or [phone]   [place]   [address] [place], [place] [phone]   [facility] [address] [place], [place] [phone] Insurance/Medicaid/sponsorship through [facility] and Families [address], [place]                                    [place], [place] [phone] [place] [address], [place] [phone]    Dr. Adele Schilder  [phone]   [facility] [place]  [facility]. 1) [address], [place] 2) [place] 3)  [place] [address], [place] [phone]  [phone]   [place] [phone] or [phone] (After Hours)

## 2014-05-23 NOTE — ED Provider Notes (Signed)
CSN: [id number]     Arrival date & time 05/23/14  1524 History  This chart was scribed for non-physician practitioner, Brent General, PA-C,  working with Quintella Reichert, MD by Evelene Croon, ED Scribe. This patient was seen in room TR09C/TR09C and the patient's care was started at 4:35 PM.    Chief Complaint  Patient presents with  . Hand Pain    The history is provided by the patient. No language interpreter was used.    HPI Comments:  Carol Dickson is a 49 y.o. female who presents to the Emergency Department complaining of moderate constant pain and swelling to her right hand pain following injury yesterday while working in her yard. She states the swelling started in her right hand but has moved up to her wrist area.  She reports prior ligament tear to the same hand and states she has a MRI scheduled for 05/27/2014. She has no other complaints/symptoms at this time. No alleviating factors noted. Ortho- Dr. Tonita Cong   Pt also complains of busted blood vessel to her left eye after straining to lift something today. 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 She reports gritty sensation in eye.   Past Medical History  Diagnosis Date  . Acid reflux   . Thyroid disease   . Coronary artery disease   . Bradyarrhythmia   . Arthritis   . Hypotension   . Arterio-venous malformation   . High cholesterol   . Fibroid tumor   . Ovarian cyst   . Polyp of esophagus   . Blind   . Cholecystitis   . Cancer    Past Surgical History  Procedure Laterality Date  . Breast surgery     History reviewed. No pertinent family history. History  Substance Use Topics  . Smoking status: Never Smoker   . Smokeless tobacco: Not on file  . Alcohol Use: No   OB History    No data available     Review of Systems  Eyes: Positive for redness.  Musculoskeletal: Positive for myalgias.      Allergies  Egg yolk  Home Medications   Prior to Admission medications    Medication Sig Start Date End Date Taking? Authorizing Provider  Levonorgestrel (MIRENA IU) by Intrauterine route.    Historical Provider, MD  OVER THE COUNTER MEDICATION Take 0.5 tablets by mouth daily. OTC Thyroid Support    Historical Provider, MD   BP 122/80 mmHg  Pulse 58  Temp(Src) 98.4 F (36.9 C) (Oral)  Resp 18  SpO2 100%  LMP 05/13/2014 Physical Exam  Constitutional: She appears well-developed and well-nourished. No distress.  HENT:  Head: Normocephalic and atraumatic.  Eyes: Conjunctivae and EOM are normal.  Small isolated area of erythema on the lateral portion of her left sclera. Nml visual acuity     Neck: Normal range of motion.  Pulmonary/Chest: Effort normal.  Musculoskeletal:  Mild swelling to radial aspect right dorsal hand Pain throughout metacarpals to mid forearm dorsally Mild swelling dorsal forearm  Radial pulse 2+ Cap refill less than 2 seconds. 5 out of 5 motor strength noted in shoulder, elbow, wrist read grip strength 5 out of 5. Distal sensation intact.  Neurological: She is alert.  Skin: Skin is warm and dry.  Nursing note and vitals reviewed.   ED Course  Procedures   DIAGNOSTIC STUDIES:  Oxygen Saturation is 100% on RA, normal by my interpretation.    COORDINATION OF CARE:  4:40  PM Offered pain meds and XR which pt declined. Pt will follow up with ortho and optometrist.    Labs Review Labs Reviewed - No data to display  Imaging Review No results found.   EKG Interpretation None      MDM   Final diagnoses:  Wrist pain, acute, right  Subconjunctival hemorrhage of left eye    Patient here with wrist pain since January worsening since yesterday after overuse, patient reporting have a previous injury in that wrist, which she states was a ligamentous injury previously assessed at the [place]. Patient states states her injury feels worsened after lifting an object. She states she heard a pop in her wrist. Patient is neurovascularly  intact, has full range of motion and strength. No concern for frank tear, although this cannot be ruled out with further imaging. Patient states she has an MRI scheduled on Monday for her wrist. I offered patient radiographs tonight to evaluate if she has any osseous injury, however patient states she does not feel like this is necessary, and states she does not wish to have the x-rays tonight. Patient also complains of a mild scleral hematoma immediately after lifting a heavy object with her hand. Patient states she felt she strained to hard and then noticed the mild redness in her sclera. This is consistent with a subconjunctival hemorrhage or scleral hematoma--I offered further evaluation of patient's eye, however still patient states she does not feel like is necessary either. Patient given wrist splint and discharged with encouragement to follow with orthopedic surgeon, as well as ophthalmology should her symptoms not resolve. I discussed return precautions with patient, and patient verbalizes understanding and agreement of this plan. I encouraged patient to call or return to the ER should she have any questions or concerns.  I personally performed the services described in this documentation, which was scribed in my presence. The recorded information has been reviewed and is accurate.  BP 122/80 mmHg  Pulse 58  Temp(Src) 98.4 F (36.9 C) (Oral)  Resp 18  SpO2 100%  LMP 05/13/2014  Signed,  Dahlia Bailiff, PA-C 2:34 AM   Dahlia Bailiff, PA-C 05/24/14 0234  Quintella Reichert, MD 05/28/14 [phone]

## 2016-09-30 ENCOUNTER — Emergency Department (HOSPITAL_COMMUNITY)
Admission: EM | Admit: 2016-09-30 | Discharge: 2016-10-01 | Disposition: A | Discharge status: Home / Self Care | Payer: Non-veteran care | Attending: Emergency Medicine | Admitting: Emergency Medicine

## 2016-09-30 ENCOUNTER — Encounter (HOSPITAL_COMMUNITY): Payer: Self-pay | Admitting: Emergency Medicine

## 2016-09-30 DIAGNOSIS — R8281 Pyuria: Secondary | ICD-10-CM

## 2016-09-30 DIAGNOSIS — N390 Urinary tract infection, site not specified: Secondary | ICD-10-CM | POA: Insufficient documentation

## 2016-09-30 DIAGNOSIS — E785 Hyperlipidemia, unspecified: Secondary | ICD-10-CM | POA: Insufficient documentation

## 2016-09-30 DIAGNOSIS — E7800 Pure hypercholesterolemia, unspecified: Secondary | ICD-10-CM | POA: Insufficient documentation

## 2016-09-30 DIAGNOSIS — Y939 Activity, unspecified: Secondary | ICD-10-CM | POA: Insufficient documentation

## 2016-09-30 DIAGNOSIS — W1781XA Fall down embankment (hill), initial encounter: Secondary | ICD-10-CM | POA: Insufficient documentation

## 2016-09-30 DIAGNOSIS — W19XXXA Unspecified fall, initial encounter: Secondary | ICD-10-CM

## 2016-09-30 DIAGNOSIS — M25561 Pain in right knee: Secondary | ICD-10-CM

## 2016-09-30 DIAGNOSIS — R001 Bradycardia, unspecified: Secondary | ICD-10-CM | POA: Insufficient documentation

## 2016-09-30 DIAGNOSIS — Y998 Other external cause status: Secondary | ICD-10-CM | POA: Insufficient documentation

## 2016-09-30 DIAGNOSIS — I2510 Atherosclerotic heart disease of native coronary artery without angina pectoris: Secondary | ICD-10-CM | POA: Insufficient documentation

## 2016-09-30 DIAGNOSIS — S60416A Abrasion of right little finger, initial encounter: Secondary | ICD-10-CM | POA: Insufficient documentation

## 2016-09-30 DIAGNOSIS — S90811A Abrasion, right foot, initial encounter: Secondary | ICD-10-CM | POA: Insufficient documentation

## 2016-09-30 DIAGNOSIS — Y929 Unspecified place or not applicable: Secondary | ICD-10-CM | POA: Insufficient documentation

## 2016-09-30 LAB — URINALYSIS, ROUTINE W REFLEX MICROSCOPIC
Bilirubin Urine: NEGATIVE
Glucose, UA: NEGATIVE mg/dL
Hgb urine dipstick: NEGATIVE
Ketones, ur: NEGATIVE mg/dL
Nitrite: NEGATIVE
PROTEIN: 100 mg/dL — AB
Specific Gravity, Urine: 1.032 — ABNORMAL HIGH (ref 1.005–1.030)
pH: 7 (ref 5.0–8.0)

## 2016-09-30 LAB — I-STAT BETA HCG BLOOD, ED (MC, WL, AP ONLY)

## 2016-09-30 LAB — CBC
HCT: 34.2 % — ABNORMAL LOW (ref 36.0–46.0)
Hemoglobin: 11.3 g/dL — ABNORMAL LOW (ref 12.0–15.0)
MCH: 32.8 pg (ref 26.0–34.0)
MCHC: 33 g/dL (ref 30.0–36.0)
MCV: 99.1 fL (ref 78.0–100.0)
PLATELETS: 199 10*3/uL (ref 150–400)
RBC: 3.45 MIL/uL — ABNORMAL LOW (ref 3.87–5.11)
RDW: 13.6 % (ref 11.5–15.5)
WBC: 6.1 10*3/uL (ref 4.0–10.5)

## 2016-09-30 LAB — BASIC METABOLIC PANEL
Anion gap: 7 (ref 5–15)
BUN: 15 mg/dL (ref 6–20)
CO2: 26 mmol/L (ref 22–32)
CREATININE: 1.29 mg/dL — AB (ref 0.44–1.00)
Calcium: 9.4 mg/dL (ref 8.9–10.3)
Chloride: 106 mmol/L (ref 101–111)
GFR calc Af Amer: 55 mL/min — ABNORMAL LOW (ref >60)
GFR calc non Af Amer: 47 mL/min — ABNORMAL LOW (ref >60)
GLUCOSE: 116 mg/dL — AB (ref 65–99)
Potassium: 3.5 mmol/L (ref 3.5–5.1)
Sodium: 139 mmol/L (ref 135–145)

## 2016-09-30 NOTE — ED Triage Notes (Signed)
Pt presents to ED for assessment stating she is not able to get her disability approved because patient does not have proof of all of the falls and other issues surrounding her chronic diseases.  Pt states she has generalized body pain 10/10 all of the time.  Denied specific injury at first.  Now stating she is having knee pain, arm pain, and jaw pain for months.  Most recent fall was two days ago, mechanical in nature, but patient states she almost passed out because she couldn't breath.

## 2016-09-30 NOTE — ED Notes (Signed)
Pt given mesh underwear per PA request

## 2016-09-30 NOTE — ED Notes (Signed)
Pt states they are refusing tx when registration in room trying to get signatures.
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
## 2016-09-30 NOTE — ED Notes (Signed)
ED Provider at bedside. 

## 2016-09-30 NOTE — ED Notes (Signed)
Pt refused vitals. Did not want to sign.

## 2017-02-10 ENCOUNTER — Emergency Department (HOSPITAL_COMMUNITY)
Admission: EM | Admit: 2017-02-10 | Discharge: 2017-02-10 | Discharge status: Against Medical Advice | Payer: Non-veteran care | Attending: Emergency Medicine | Admitting: Emergency Medicine

## 2017-02-10 ENCOUNTER — Encounter (HOSPITAL_COMMUNITY): Payer: Self-pay | Admitting: Emergency Medicine

## 2017-02-10 ENCOUNTER — Other Ambulatory Visit: Payer: Self-pay

## 2017-02-10 DIAGNOSIS — Y93G3 Activity, cooking and baking: Secondary | ICD-10-CM | POA: Diagnosis not present

## 2017-02-10 DIAGNOSIS — Y999 Unspecified external cause status: Secondary | ICD-10-CM | POA: Insufficient documentation

## 2017-02-10 DIAGNOSIS — T300 Burn of unspecified body region, unspecified degree: Secondary | ICD-10-CM

## 2017-02-10 DIAGNOSIS — T23102A Burn of first degree of left hand, unspecified site, initial encounter: Secondary | ICD-10-CM | POA: Insufficient documentation

## 2017-02-10 DIAGNOSIS — Y92000 Kitchen of unspecified non-institutional (private) residence as  the place of occurrence of the external cause: Secondary | ICD-10-CM | POA: Insufficient documentation

## 2017-02-10 DIAGNOSIS — X101XXA Contact with hot food, initial encounter: Secondary | ICD-10-CM | POA: Diagnosis not present

## 2017-02-10 DIAGNOSIS — R2689 Other abnormalities of gait and mobility: Secondary | ICD-10-CM | POA: Insufficient documentation

## 2017-02-10 DIAGNOSIS — S6992XA Unspecified injury of left wrist, hand and finger(s), initial encounter: Secondary | ICD-10-CM | POA: Diagnosis present

## 2017-02-10 DIAGNOSIS — T23101A Burn of first degree of right hand, unspecified site, initial encounter: Secondary | ICD-10-CM | POA: Insufficient documentation

## 2017-02-10 DIAGNOSIS — Z79899 Other long term (current) drug therapy: Secondary | ICD-10-CM | POA: Diagnosis not present

## 2017-02-10 DIAGNOSIS — I2510 Atherosclerotic heart disease of native coronary artery without angina pectoris: Secondary | ICD-10-CM | POA: Insufficient documentation

## 2017-02-10 HISTORY — DX: Tourette's disorder: F95.2
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
## 2017-08-15 ENCOUNTER — Emergency Department (HOSPITAL_COMMUNITY): Payer: Non-veteran care

## 2017-08-15 ENCOUNTER — Other Ambulatory Visit: Payer: Self-pay

## 2017-08-15 ENCOUNTER — Emergency Department (HOSPITAL_COMMUNITY)
Admission: EM | Admit: 2017-08-15 | Discharge: 2017-08-15 | Disposition: A | Discharge status: Home / Self Care | Payer: Non-veteran care | Attending: Emergency Medicine | Admitting: Emergency Medicine

## 2017-08-15 ENCOUNTER — Encounter (HOSPITAL_COMMUNITY): Payer: Self-pay

## 2017-08-15 DIAGNOSIS — Z859 Personal history of malignant neoplasm, unspecified: Secondary | ICD-10-CM | POA: Diagnosis not present

## 2017-08-15 DIAGNOSIS — I2510 Atherosclerotic heart disease of native coronary artery without angina pectoris: Secondary | ICD-10-CM | POA: Diagnosis not present

## 2017-08-15 DIAGNOSIS — E079 Disorder of thyroid, unspecified: Secondary | ICD-10-CM | POA: Diagnosis not present

## 2017-08-15 DIAGNOSIS — M79671 Pain in right foot: Secondary | ICD-10-CM

## 2017-08-15 DIAGNOSIS — G501 Atypical facial pain: Secondary | ICD-10-CM | POA: Insufficient documentation

## 2017-08-15 DIAGNOSIS — R51 Headache: Secondary | ICD-10-CM

## 2017-08-15 DIAGNOSIS — R519 Headache, unspecified: Secondary | ICD-10-CM

## 2017-08-15 NOTE — ED Notes (Signed)
PT is able to walk from room to wheelchair in hallway. PT wheeled out to waiting room by ED tech.

## 2017-08-15 NOTE — Discharge Instructions (Addendum)
Use ice on your face and ankle to help with pain and swelling.  Use aloe cream on your face to help with pain.  Follow up with your primary doctor if you pain is not improving in 1 week.  Return to the ER if you develop difficulty breathing, worsening symptoms, or any new or concerning symptoms.

## 2017-08-15 NOTE — ED Notes (Signed)
PT states she "can't stand while my tourette's is acting up." PT refuses [place] calling her son or daughter.

## 2017-08-15 NOTE — ED Notes (Signed)
Pt called for room from the lobby, pt states "I cant do it" saying that she "cant get up and get into the wheelchair and you need to get help because I'm not getting up, my right side is numb and won't move." Staff then proceeds to basically pick her up and put her into the wheelchair and the same to get her into the room. Once in room she moves both sides perfectly fine.
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
## 2017-10-09 ENCOUNTER — Emergency Department (HOSPITAL_COMMUNITY)
Admission: EM | Admit: 2017-10-09 | Discharge: 2017-10-09 | Disposition: A | Discharge status: Home / Self Care | Payer: Non-veteran care | Attending: Emergency Medicine | Admitting: Emergency Medicine

## 2017-10-09 ENCOUNTER — Encounter (HOSPITAL_COMMUNITY): Payer: Self-pay

## 2017-10-09 DIAGNOSIS — E039 Hypothyroidism, unspecified: Secondary | ICD-10-CM | POA: Diagnosis not present

## 2017-10-09 DIAGNOSIS — I2510 Atherosclerotic heart disease of native coronary artery without angina pectoris: Secondary | ICD-10-CM | POA: Insufficient documentation

## 2017-10-09 DIAGNOSIS — B353 Tinea pedis: Secondary | ICD-10-CM | POA: Diagnosis not present

## 2017-10-09 DIAGNOSIS — L819 Disorder of pigmentation, unspecified: Secondary | ICD-10-CM | POA: Diagnosis present

## 2017-10-09 DIAGNOSIS — L280 Lichen simplex chronicus: Secondary | ICD-10-CM | POA: Insufficient documentation

## 2017-10-09 DIAGNOSIS — Z859 Personal history of malignant neoplasm, unspecified: Secondary | ICD-10-CM | POA: Diagnosis not present

## 2017-10-09 MED ORDER — KETOCONAZOLE 2 % EX CREA: 1.0000 "application " | TOPICAL_CREAM | Freq: Every day | CUTANEOUS | 0 refills | Status: DC

## 2017-10-09 MED ORDER — TRIAMCINOLONE ACETONIDE 0.025 % EX OINT: 1.0000 "application " | TOPICAL_OINTMENT | Freq: Two times a day (BID) | CUTANEOUS | 0 refills | Status: DC

## 2017-10-09 NOTE — ED Notes (Signed)
Pt stable, states understanding of discharge instructions 

## 2017-10-09 NOTE — ED Triage Notes (Signed)
Patient complains of bilateral foot and toe pain x 1 month after dropping can on foot. States that her toe nails are black, appears some lent on toe nails

## 2017-10-09 NOTE — ED Provider Notes (Signed)
[facility] EMERGENCY DEPARTMENT Provider Note   CSN: [id number] Arrival date & time: 10/09/17  1126     History   Chief Complaint No chief complaint on file.   HPI Carol Dickson is a 52 y.o. female with a h/o of CAD, CVA w/ right-side deficits, and peripheral neuropathy who presents to the Emergency Department with a chief complaint of black discoloration to the bilateral toes and to the right ankle.  She also endorses yellowing of the toenails on the bilateral feet.  He states this all began 1 month ago after a 30-lb bag of trash fell on her feet.  She also endorses pain to the right ankle for the last month.  She has some weakness, but states that this is from her previous stroke.  She is concerned that she has an infection in her feet so she has not started wearing a new pair of shoes that she purchased.  She reports that she is treated her symptoms by rubbing her feet and ankle really hard, but was unable to get off the black discoloration.  She has been wearing a pair of black clogs with black applied to the inside of the shoes.  She does not wear socks.  She reports that her feet sweat extensively throughout the day.   The patient currently lives with her daughter who assist her with her activities of daily living.  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  She is also concerned about some swelling to the bilateral ankles that is been occurring daily for the past month.  She also notes that the black discoloration over her right ankle is pruritic.  Swelling is worse in the evening and improved in the mornings.  She denies fever, chills, new falls or injuries, redness or warmth to the bilateral lower extremities.  The history is provided by the patient. No language interpreter was used.    Past Medical History:  Diagnosis Date  . Acid reflux   . Arterio-venous malformation   . Arthritis   .  Blind   . Bradyarrhythmia   . Cancer ([place])   . Cholecystitis   . Coronary artery disease   . Fibroid tumor   . High cholesterol   . Hypotension   . Ovarian cyst   . Polyp of esophagus   . Thyroid disease   . Tourette syndrome     There are no active problems to display for this patient.   Past Surgical History:  Procedure Laterality Date  . BREAST SURGERY       OB History   None      Home Medications    Prior to Admission medications   Medication Sig Start Date End Date Taking? Authorizing Provider  ketoconazole (NIZORAL) 2 % cream Apply 1 application topically daily. 10/09/17   Patsy Varma A, PA-C  Levonorgestrel (MIRENA IU) by Intrauterine route.    [provider]  OVER THE COUNTER MEDICATION Take 0.5 tablets by mouth daily. OTC Thyroid Support    [provider]  triamcinolone (KENALOG) 0.025 % ointment Apply 1 application topically 2 (two) times daily. 10/09/17   Kaine Mcquillen A, PA-C    Family History No family history on file.  Social History Social History   Tobacco Use  . Smoking status: Never Smoker  . Smokeless tobacco: Never Used  Substance Use Topics  . Alcohol use: No  . Drug  use: No     Allergies   Egg yolk   Review of Systems Review of Systems  Constitutional: Negative for activity change, chills and fever.  Respiratory: Negative for shortness of breath.   Cardiovascular: Negative for chest pain.  Gastrointestinal: Negative for abdominal pain.  Musculoskeletal: Positive for arthralgias, gait problem, joint swelling and myalgias. Negative for back pain.  Skin: Positive for color change and wound. Negative for rash.  Neurological: Negative for weakness and numbness.   Physical Exam Updated Vital Signs BP 128/90 (BP Location: Right Arm)   Pulse (!) 51   Temp 99 F (37.2 C) (Oral)   Resp 16   SpO2 98%   Physical Exam  Constitutional: No distress.  Strong odor of urine.  Patient appears disheveled with poor  hygiene.  HENT:  Head: Normocephalic.  Eyes: Conjunctivae are normal.  Neck: Neck supple.  Cardiovascular: Normal rate and regular rhythm. Exam reveals no gallop and no friction rub.  No murmur heard. Pulmonary/Chest: Effort normal. No respiratory distress.  Abdominal: Soft. She exhibits no distension.  Neurological: She is alert.  Symmetric tandem gait.  Able to bear weight on bilateral lower extremities.  Skin: Skin is warm. No rash noted.  See pictures below.  Approval by the patient prior to taking images was received and all images were not stored on a personal electronic device.  Yellowish discoloration is noted to the toenails of the bilateral feet.  There also appears to be black matted material that looks consistent with correlating the patient's shoes in the interdigital space of the bilateral feet and to some of the toenails.   There is also a large area of hyperkeratotic, excoriated skin with some scaling noted to the skin overlying the lateral malleolus of the right ankle.  Mild edema noted to the bilateral ankles.  DP and PT pulses are 2+ and symmetric.  No inter-digital fissures.  NVI.  No surrounding erythema or warmth.  Psychiatric: Her behavior is normal.  Nursing note and vitals reviewed.            ED Treatments / Results  Labs (all labs ordered are listed, but only abnormal results are displayed) Labs Reviewed - No data to display  EKG None  Radiology No results found.  Procedures Procedures (including critical care time)  Medications Ordered in ED Medications - No data to display   Initial Impression / Assessment and Plan / ED Course  I have reviewed the triage vital signs and the nursing notes.  Pertinent labs & imaging results that were available during my care of the patient were reviewed by me and considered in my medical decision making (see chart for details).     52 year old female with a h/o of CAD, CVA w/ right-side deficits,  and peripheral neuropathy presenting with bilateral foot discoloration, mild, nonpitting edema to bilateral ankles, and mild pain to the right ankle.  See pictures above.  There appears to be some tinea pedis to the toenails of the bilateral feet.  There also appears to be fabric from the inside of the patient's shoes matted to the patient's toenails and between her toes.  She reports that her feet sweat heavily throughout the day and she does not wear socks with her shoes.  Wound care provided to remove the matted material in the ED.  She is also concerned about discolored lichenified area to the right lateral ankle that is pruritic.  Lesion appears consistent with lichen simplex chronicus and Rx for triamcinolone will  be given along with instructions on moist dressings with plastic wrap.  She has been given an Rx for ketoconazole for her tinea pedis.  She is established with the [facility] and I have recommended PCP or dermatology follow-up if her symptoms do not improve in the next 1 to 2 weeks.  We have also had a lengthy discussion on home hygiene regarding her feet.  I encouraged her to switch to her new pair of shoes and strongly encourage the patient to start wearing socks.  No signs of cellulitis or other bacterial infection.  She also has some dependent peripheral edema that I suspect is secondary to her peripheral neuropathy and from spending long amounts of time her feet throughout the day.  Doubt CHF or hypervolemia.  Strict return precautions given.  Patient is hemodynamically stable in no acute distress.  She is safe for discharge at this time.  Final Clinical Impressions(s) / ED Diagnoses   Final diagnoses:  Lichen simplex chronicus  Tinea pedis of both feet    ED Discharge Orders        Ordered    triamcinolone (KENALOG) 0.025 % ointment  2 times daily,   Status:  Discontinued     10/09/17 1519    ketoconazole (NIZORAL) 2 % cream  Daily,   Status:  Discontinued     10/09/17 1519     ketoconazole (NIZORAL) 2 % cream  Daily     10/09/17 1524    triamcinolone (KENALOG) 0.025 % ointment  2 times daily     10/09/17 1524       Kaelene Elliston A, PA-C 10/09/17 1550    Elnora Morrison, MD 10/09/17 [phone]

## 2017-10-09 NOTE — Discharge Instructions (Signed)
Thank you for allowing me to care for you today in the Emergency Department.   It is important that you keep the area clean between the toes and around the fingernails with warm water and soap at least once daily.  If your feet continue to sweat heavily, wearing socks can help protect your feet from having worsening symptoms.  I would also recommend switching to your new pair of shoes.  Apply a thin layer of triamcinolone ointment to the dark skin at the right ankle 2 times daily.  I would applying the ointment before going to bed at night and then wrapping the foot and ankle and plastic wrap, using a plastic bag to cover the foot and ankle, or wearing socks to help keep the area moist.  Moisture over the ankle is good, but not recommended for between the toes.  If there is no improvement in the skin on your right ankle after 2 weeks, I would recommend following up with primary care or with dermatology.  Do not attempt to heavily scrub based dark skin on the right ankle just gently clean the area with warm water and soap.  For the yellowing of your toenails, apply a thin layer of ketoconazole cream daily for up to 2 weeks.  Wearing socks with your shoes will also help with this condition.   Take 650 mg of Tylenol every 6 hours for pain control.  Elevate your legs above the level of your heart when you are sitting and resting to help with the swelling in your ankles.  Return to the emergency department if you develop a high fever, if the feet or ankles get red and hot to the touch, or if you develop new, worsening symptoms.

## 2017-10-19 ENCOUNTER — Emergency Department (HOSPITAL_COMMUNITY)
Admission: EM | Admit: 2017-10-19 | Discharge: 2017-10-19 | Disposition: A | Discharge status: Home / Self Care | Payer: Non-veteran care | Attending: Emergency Medicine | Admitting: Emergency Medicine

## 2017-10-19 ENCOUNTER — Other Ambulatory Visit: Payer: Self-pay

## 2017-10-19 ENCOUNTER — Encounter (HOSPITAL_COMMUNITY): Payer: Self-pay | Admitting: *Deleted

## 2017-10-19 ENCOUNTER — Emergency Department (HOSPITAL_COMMUNITY): Payer: Non-veteran care

## 2017-10-19 DIAGNOSIS — Z79899 Other long term (current) drug therapy: Secondary | ICD-10-CM | POA: Diagnosis not present

## 2017-10-19 DIAGNOSIS — W228XXA Striking against or struck by other objects, initial encounter: Secondary | ICD-10-CM | POA: Diagnosis not present

## 2017-10-19 DIAGNOSIS — Y999 Unspecified external cause status: Secondary | ICD-10-CM | POA: Diagnosis not present

## 2017-10-19 DIAGNOSIS — M25572 Pain in left ankle and joints of left foot: Secondary | ICD-10-CM | POA: Diagnosis not present

## 2017-10-19 DIAGNOSIS — E079 Disorder of thyroid, unspecified: Secondary | ICD-10-CM | POA: Diagnosis not present

## 2017-10-19 DIAGNOSIS — S60941A Unspecified superficial injury of left index finger, initial encounter: Secondary | ICD-10-CM | POA: Diagnosis not present

## 2017-10-19 DIAGNOSIS — S6990XA Unspecified injury of unspecified wrist, hand and finger(s), initial encounter: Secondary | ICD-10-CM

## 2017-10-19 DIAGNOSIS — Y939 Activity, unspecified: Secondary | ICD-10-CM | POA: Diagnosis not present

## 2017-10-19 DIAGNOSIS — B353 Tinea pedis: Secondary | ICD-10-CM | POA: Diagnosis not present

## 2017-10-19 DIAGNOSIS — Y929 Unspecified place or not applicable: Secondary | ICD-10-CM | POA: Insufficient documentation

## 2017-10-19 DIAGNOSIS — I2510 Atherosclerotic heart disease of native coronary artery without angina pectoris: Secondary | ICD-10-CM | POA: Insufficient documentation

## 2017-10-19 DIAGNOSIS — M25571 Pain in right ankle and joints of right foot: Secondary | ICD-10-CM

## 2017-10-19 DIAGNOSIS — F952 Tourette's disorder: Secondary | ICD-10-CM | POA: Insufficient documentation

## 2017-10-19 HISTORY — DX: Unspecified hearing loss, unspecified ear: H91.90

## 2017-10-19 NOTE — ED Notes (Signed)
Declined W/C at D/C and was escorted to lobby by RN. 
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
## 2017-10-19 NOTE — ED Triage Notes (Signed)
Pt returning to ER states she was unable to fill prescriptions. Reports continued foot pain.

## 2017-10-19 NOTE — Discharge Instructions (Signed)
Follow up with [employer] at [phone], concerning your foreclosure.   Call [employer], concerning utility bills, [phone], to see what funds they have available.   Local [employer] in [place] is located at [address], [place]. Phone number is [phone].

## 2017-10-19 NOTE — ED Provider Notes (Signed)
[facility] EMERGENCY DEPARTMENT Provider Note   CSN: [id number] Arrival date & time: 10/19/17  1449     History   Chief Complaint Chief Complaint  Patient presents with  . Foot Pain    HPI Carol Dickson is a 52 y.o. female.  Who presents the emergency department for evaluation of finger injury and foot and ankle pain.  Patient has a history of situs and joint pain.  She has a previous work-up for RA which was negative.  Patient has a history of thyroid disorder and Tourette's syndrome.  The patient was seen for foot pain about 1 month ago and prescribed ketoconazole and triamcinolone however she was unable to afford the medications.  She was told that she had tinea pedis and lichen planus the patient states that she thinks that the rash is causing her ankles to hurt.  She does have a history of osteoarthritis of both ankles.  She denies any new injuries.  She denies numbness or tingling.  It is also complaining of pain in the left index finger which after she hit it with a hammer about a week ago.  She states she is having difficulty moving the second MCP joint.  She denies numbness or tingling.  HPI  Past Medical History:  Diagnosis Date  . Acid reflux   . Arterio-venous malformation   . Arthritis   . Blind   . Bradyarrhythmia   . Cancer ([place])   . Cholecystitis   . Coronary artery disease   . Fibroid tumor   . High cholesterol   . Hypotension   . Ovarian cyst   . Polyp of esophagus   . Thyroid disease   . Tourette syndrome     There are no active problems to display for this patient.   Past Surgical History:  Procedure Laterality Date  . BREAST SURGERY       OB History   None      Home Medications    Prior to Admission medications   Medication Sig Start Date End Date Taking? Authorizing Provider  ketoconazole (NIZORAL) 2 % cream Apply 1 application topically daily. 10/09/17   McDonald, Mia A, PA-C  Levonorgestrel (MIRENA IU) by Intrauterine  route.    [provider]  OVER THE COUNTER MEDICATION Take 0.5 tablets by mouth daily. OTC Thyroid Support    [provider]  triamcinolone (KENALOG) 0.025 % ointment Apply 1 application topically 2 (two) times daily. 10/09/17   McDonald, Mia A, PA-C    Family History No family history on file.  Social History Social History   Tobacco Use  . Smoking status: Never Smoker  . Smokeless tobacco: Never Used  Substance Use Topics  . Alcohol use: No  . Drug use: No     Allergies   Egg yolk   Review of Systems Review of Systems  Ten systems reviewed and are negative for acute change, except as noted in the HPI.   Physical Exam Updated Vital Signs BP 117/73 (BP Location: Left Arm)   Pulse (!) 56   Temp 98.9 F (37.2 C) (Oral)   Resp 16   Ht 4\' 11"  (1.499 m)   Wt 54.4 kg (120 lb)   LMP 05/13/2014   SpO2 100%   BMI 24.24 kg/m   Physical Exam  Constitutional: She is oriented to person, place, and time. She appears well-developed and well-nourished. No distress.  HENT:  Head: Normocephalic and atraumatic.  Eyes: Conjunctivae are normal. No scleral  icterus.  Neck: Normal range of motion.  Cardiovascular: Normal rate, regular rhythm and normal heart sounds. Exam reveals no gallop and no friction rub.  No murmur heard. Pulmonary/Chest: Effort normal and breath sounds normal. No respiratory distress.  Abdominal: Soft. Bowel sounds are normal. She exhibits no distension and no mass. There is no tenderness. There is no guarding.  Musculoskeletal:  Full range motion bilateral ankles, minimal nonpitting swelling over the bilateral malleoli of both ankles.  Strong DP and PT pulses, normal capillary refill  Neurological: She is alert and oriented to person, place, and time.  Skin: Skin is warm and dry. She is not diaphoretic.  Psychiatric: Her behavior is normal.  Mild scaling in a moccasin distribution on bilateral feet  Nursing note and vitals  reviewed.    ED Treatments / Results  Labs (all labs ordered are listed, but only abnormal results are displayed) Labs Reviewed - No data to display  EKG None  Radiology No results found.  Procedures Procedures (including critical care time)  Medications Ordered in ED Medications - No data to display   Initial Impression / Assessment and Plan / ED Course  I have reviewed the triage vital signs and the nursing notes.  Pertinent labs & imaging results that were available during my care of the patient were reviewed by me and considered in my medical decision making (see chart for details).     Patient with bilateral ankle pain, negative x-ray.  I personally reviewed the left finger x-ray and agree with radiologic interpretation.  Patient set up with [facility] benefits here by our social worker Jacqulyn Liner.  She has a meeting tomorrow with a veterans advocacy group.  Patient appears appropriate for discharge at this time.  Final Clinical Impressions(s) / ED Diagnoses   Final diagnoses:  None    ED Discharge Orders    None       Margarita Mail, PA-C 10/19/17 1657    Lajean Saver, MD 10/21/17 [phone]

## 2017-11-08 ENCOUNTER — Other Ambulatory Visit: Payer: Self-pay

## 2017-11-08 ENCOUNTER — Encounter (INDEPENDENT_AMBULATORY_CARE_PROVIDER_SITE_OTHER): Payer: Self-pay | Admitting: Physician Assistant

## 2017-11-08 ENCOUNTER — Ambulatory Visit (INDEPENDENT_AMBULATORY_CARE_PROVIDER_SITE_OTHER): Payer: Non-veteran care | Admitting: Physician Assistant

## 2017-11-08 ENCOUNTER — Other Ambulatory Visit (INDEPENDENT_AMBULATORY_CARE_PROVIDER_SITE_OTHER): Payer: Self-pay | Admitting: Physician Assistant

## 2017-11-08 VITALS — BP 126/79 | HR 55 | Temp 98.3°F | Ht 59.0 in | Wt 135.8 lb

## 2017-11-08 DIAGNOSIS — H547 Unspecified visual loss: Secondary | ICD-10-CM | POA: Diagnosis not present

## 2017-11-08 DIAGNOSIS — M25571 Pain in right ankle and joints of right foot: Secondary | ICD-10-CM

## 2017-11-08 DIAGNOSIS — R809 Proteinuria, unspecified: Secondary | ICD-10-CM

## 2017-11-08 DIAGNOSIS — M25572 Pain in left ankle and joints of left foot: Secondary | ICD-10-CM

## 2017-11-08 DIAGNOSIS — F952 Tourette's disorder: Secondary | ICD-10-CM | POA: Diagnosis not present

## 2017-11-08 DIAGNOSIS — R7989 Other specified abnormal findings of blood chemistry: Secondary | ICD-10-CM

## 2017-11-08 DIAGNOSIS — G8929 Other chronic pain: Secondary | ICD-10-CM

## 2017-11-08 MED ORDER — NAPROXEN 500 MG PO TABS: 500.0000 mg | ORAL_TABLET | Freq: Two times a day (BID) | ORAL | 0 refills | Status: DC

## 2017-11-08 NOTE — Patient Instructions (Signed)
Tourette Syndrome Tourette syndrome (TS) is a condition that affects the nervous system (neurological condition). TS is characterized by tics, which are repeated involuntary movements and uncontrollable vocal sounds. TS is usually passed along from parent to child (inherited). TS is a lifelong (chronic) condition that may get better over time. It often occurs along with other disorders. In rare cases, TS is not inherited. In these cases, the condition is referred to as sporadic Tourette syndrome. TS may also be called Gilles de la Tourette syndrome or Tourette's syndrome. What are the causes? The cause of this condition is not known. What increases the risk? This condition is more likely to develop in:  Men.  People with a family history of TS, tic disorders, or obsessive compulsive disorder (OCD).  What are the signs or symptoms? The main symptoms of this condition are repetitive verbal (phonic) or physical (motor) tics. Tics are involuntary and unintentional. They may begin in one part of the body and spread. Tics vary greatly among people with TS. They often change over time in frequency, type, and severity. Motor Tics  Head jerking.  Neck stretching.  Jumping.  Foot stamping.  Twisting and bending the body.  Phonic Tics  Shouts.  Barks or yelps.  Grunts.  Sniffs.  Coughs.  Throat clearing.  Inappropriate words and phrases (rare).  Other symptoms  Expression of more than one tic in a row or at the same time (complex tic).  Involuntary urges to express tics, followed by relief after tics are expressed. This is similar to the urge to scratch an itch or sneeze.  A feeling of tension, burning, or tingling that builds up until you express a tic.  Increased frequency or severity of tics during times of stress or fatigue.  Self-harming behavior (rare). Sometimes, tics improve over time. It is possible that tics may go away completely in adulthood (remission). How is  this diagnosed? This condition is diagnosed by your health care provider observing your tics. TS is usually diagnosed during childhood or adolescence. Your health care provider will also evaluate your family medical history. Tests that may be done include:  Blood tests.  MRI.  CT scan.  Electroencephalogram (EEG).  Tourette syndrome can be associated with other conditions (comorbid conditions or comorbidities), such as:  OCD. This is a common comorbidity.  Attention deficit hyperactivity disorder (ADHD).  Learning disabilities.  Behavioral problems.  If you have a comorbid condition, you may be referred to a health care provider who specializes in that condition.  How is this treated?  There is no cure for this condition. However, TS can be managed by controlling tics and treating comorbid conditions, if necessary. Treatment may include:  Recommendations for a support group or educational resources.  Behavioral therapy.  Supportive counseling and therapy.  Being prescribed medicines. This may be needed if your symptoms interfere with your daily life.  Follow these instructions at home:  Take over-the-counter and prescription medicines only as told by your health care provider.  Follow instructions from your health care provider about eating or drinking restrictions. You may not be able to drink alcohol if you are taking certain medicines.  Pay attention to any changes in your symptoms.  Tell your health care provider if you are pregnant or may be pregnant.  Educate yourself and the people around you about your condition.  Keep all follow-up visits as told by your health care provider. Contact a health care provider if:  You have side effects from medicines  that you are taking.  Your symptoms suddenly change or get worse.  You have unusual stiffness.  You have a sudden change in mood or behavior.  You feel hopeless or depressed. Get help right away if:  You  have severe side effects from medicines that you are taking.  You have difficulty breathing or talking. This information is not intended to replace advice given to you by your health care provider. Make sure you discuss any questions you have with your health care provider. Document Released: 03/01/2005 Document Revised: 11/03/2015 Document Reviewed: 07/17/2014 Elsevier Interactive Patient Education  [employer].
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
## 2017-11-10 LAB — LIPID PANEL
Chol/HDL Ratio: 4.9 ratio — ABNORMAL HIGH (ref 0.0–4.4)
Cholesterol, Total: 294 mg/dL — ABNORMAL HIGH (ref 100–199)
HDL: 60 mg/dL (ref >39)
LDL Calculated: 213 mg/dL — ABNORMAL HIGH (ref 0–99)
TRIGLYCERIDES: 105 mg/dL (ref 0–149)
VLDL CHOLESTEROL CAL: 21 mg/dL (ref 5–40)

## 2017-11-10 LAB — COMPREHENSIVE METABOLIC PANEL
A/G RATIO: 1.8 (ref 1.2–2.2)
ALBUMIN: 4.8 g/dL (ref 3.5–5.5)
ALK PHOS: 76 IU/L (ref 39–117)
ALT: 21 IU/L (ref 0–32)
AST: 27 IU/L (ref 0–40)
BILIRUBIN TOTAL: 0.3 mg/dL (ref 0.0–1.2)
BUN / CREAT RATIO: 11 (ref 9–23)
BUN: 12 mg/dL (ref 6–24)
CO2: 25 mmol/L (ref 20–29)
CREATININE: 1.12 mg/dL — AB (ref 0.57–1.00)
Calcium: 9.5 mg/dL (ref 8.7–10.2)
Chloride: 101 mmol/L (ref 96–106)
GFR calc Af Amer: 65 mL/min/{1.73_m2} (ref >59)
GFR calc non Af Amer: 57 mL/min/{1.73_m2} — ABNORMAL LOW (ref >59)
GLOBULIN, TOTAL: 2.7 g/dL (ref 1.5–4.5)
Glucose: 95 mg/dL (ref 65–99)
POTASSIUM: 4.5 mmol/L (ref 3.5–5.2)
SODIUM: 142 mmol/L (ref 134–144)
Total Protein: 7.5 g/dL (ref 6.0–8.5)

## 2017-11-10 LAB — PROTEIN ELECTROPHORESIS, SERUM
A/G RATIO SPE: 1.3 (ref 0.7–1.7)
ALPHA 1: 0.2 g/dL (ref 0.0–0.4)
Albumin ELP: 4.2 g/dL (ref 2.9–4.4)
Alpha 2: 0.9 g/dL (ref 0.4–1.0)
BETA: 1.1 g/dL (ref 0.7–1.3)
Gamma Globulin: 1.2 g/dL (ref 0.4–1.8)
Globulin, Total: 3.3 g/dL (ref 2.2–3.9)

## 2017-11-10 LAB — SEDIMENTATION RATE: Sed Rate: 6 mm/hr (ref 0–40)

## 2017-11-10 LAB — C-REACTIVE PROTEIN: CRP: <1 mg/L (ref 0–10)

## 2017-11-10 LAB — MICROALBUMIN / CREATININE URINE RATIO
Creatinine, Urine: 207.2 mg/dL
MICROALBUM., U, RANDOM: 110.8 ug/mL
Microalb/Creat Ratio: 53.5 mg/g creat — ABNORMAL HIGH (ref 0.0–30.0)

## 2017-11-10 LAB — ANA W/REFLEX: ANA: NEGATIVE

## 2017-11-10 NOTE — Addendum Note (Signed)
Addended by: Nat Christen on: 11/10/2017 10:11 AM   Modules accepted: Orders

## 2017-11-11 ENCOUNTER — Telehealth (INDEPENDENT_AMBULATORY_CARE_PROVIDER_SITE_OTHER): Payer: Self-pay

## 2017-11-11 ENCOUNTER — Other Ambulatory Visit (INDEPENDENT_AMBULATORY_CARE_PROVIDER_SITE_OTHER): Payer: Self-pay | Admitting: Physician Assistant

## 2017-11-11 DIAGNOSIS — E7841 Elevated Lipoprotein(a): Secondary | ICD-10-CM

## 2017-11-11 MED ORDER — ATORVASTATIN CALCIUM 20 MG PO TABS: 20.0000 mg | ORAL_TABLET | Freq: Every day | ORAL | 3 refills | Status: DC

## 2017-11-11 NOTE — Telephone Encounter (Signed)
Left message asking [facility] caseworker to call [facility] at [phone]. Nat Christen, CMA

## 2017-11-11 NOTE — Telephone Encounter (Signed)
-----   Message from Clent Demark, PA-C sent at 11/11/2017 12:44 PM EDT ----- Cholesterol elevated and mildly impaired kidney filtration. I have sent atorvastatin to [facility]. Rest of labs normal.

## 2017-11-16 ENCOUNTER — Ambulatory Visit (HOSPITAL_COMMUNITY)
Admission: RE | Admit: 2017-11-16 | Discharge: 2017-11-16 | Disposition: A | Discharge status: Home / Self Care | Payer: Self-pay | Source: Ambulatory Visit | Attending: Physician Assistant | Admitting: Physician Assistant

## 2017-11-16 DIAGNOSIS — E236 Other disorders of pituitary gland: Secondary | ICD-10-CM | POA: Insufficient documentation

## 2017-11-16 DIAGNOSIS — H547 Unspecified visual loss: Secondary | ICD-10-CM | POA: Insufficient documentation

## 2017-11-16 DIAGNOSIS — F952 Tourette's disorder: Secondary | ICD-10-CM | POA: Insufficient documentation

## 2017-11-16 LAB — UPEP/TP, 24-HR URINE
ALPHA 2 UR: 12.5 %
Albumin, U: 47.9 %
Alpha 1, Urine: 5.4 %
BETA UR: 19.4 %
GAMMA UR: 14.7 %
PROTEIN 24H UR: 196 mg/(24.h) — AB (ref 30–150)
Protein, Ur: 49.1 mg/dL

## 2017-11-17 ENCOUNTER — Encounter (INDEPENDENT_AMBULATORY_CARE_PROVIDER_SITE_OTHER): Payer: Self-pay

## 2017-11-17 ENCOUNTER — Telehealth (INDEPENDENT_AMBULATORY_CARE_PROVIDER_SITE_OTHER): Payer: Self-pay

## 2017-11-17 NOTE — Telephone Encounter (Signed)
-----   Message from Clent Demark, PA-C sent at 11/16/2017 12:45 PM EDT ----- Normal 24 hr urine.

## 2017-11-17 NOTE — Telephone Encounter (Signed)
Patient does not have a phone. Contacted her case manager but he is unsure when he will see patient. States she usually pops up when she needs something. Results faxed to case manager to place in patients folder. He will likely see patient before [facility]. Nat Christen, CMA

## 2017-11-22 ENCOUNTER — Telehealth (INDEPENDENT_AMBULATORY_CARE_PROVIDER_SITE_OTHER): Payer: Self-pay

## 2017-11-22 NOTE — Telephone Encounter (Signed)
Patients only point of contact is her [facility] case Recruitment consultant. Left message for him to call [facility] at [phone]. Nat Christen, CMA

## 2017-11-22 NOTE — Telephone Encounter (Signed)
-----   Message from Clent Demark, PA-C sent at 11/18/2017  2:06 PM EDT ----- No acute intracranial abnormality.

## 2017-11-25 ENCOUNTER — Telehealth (INDEPENDENT_AMBULATORY_CARE_PROVIDER_SITE_OTHER): Payer: Self-pay

## 2017-11-25 NOTE — Telephone Encounter (Signed)
Noted  

## 2017-11-25 NOTE — Telephone Encounter (Signed)
Patient does not have a direct means of contact. Called her [facility] case worker and spoke with him about other results. He had not seen patient. He asked for results to be faxed and he would place them in patients file until she came into his office. Attempted to fax results several times and transmission keeps coming back with "no answer". Will notify patient at next [place]. Nat Christen, CMA

## 2018-01-04 ENCOUNTER — Encounter (INDEPENDENT_AMBULATORY_CARE_PROVIDER_SITE_OTHER): Payer: Self-pay | Admitting: Physician Assistant

## 2018-01-04 ENCOUNTER — Ambulatory Visit (INDEPENDENT_AMBULATORY_CARE_PROVIDER_SITE_OTHER): Payer: Non-veteran care | Admitting: Physician Assistant

## 2018-01-04 VITALS — BP 128/75 | HR 55 | Temp 97.9°F | Resp 18 | Ht <= 58 in | Wt 138.0 lb

## 2018-01-04 DIAGNOSIS — Z1211 Encounter for screening for malignant neoplasm of colon: Secondary | ICD-10-CM

## 2018-01-04 DIAGNOSIS — M25572 Pain in left ankle and joints of left foot: Secondary | ICD-10-CM

## 2018-01-04 DIAGNOSIS — E7841 Elevated Lipoprotein(a): Secondary | ICD-10-CM | POA: Diagnosis not present

## 2018-01-04 NOTE — Progress Notes (Signed)
Subjective:  Patient ID: Carol Dickson, female    DOB: 07/02/1965  Age: 52 y.o. MRN: [id number]  CC: f/u  HPI  Carol Dickson is a 52 y.o. female with a medical history of partial blindness OU, CVA 2015, hearing deficit, arterio-venous malformation, acid reflux, bradyarrhythmia, CAD, HLD, thyroid disease s/p radioablation, cancer, tourette syndrome, ovarian cyst, fibroid tumor, and polyp of esophagus presents to f/u on ankle pain. Says she is feeling much better in regards to ankle pain. Pain currently 2-3/10. Has figured out she carries too much weight in her backpack. Carries a back pack because she is blind and has misplaced her purses many times before.    Pt not taking atorvastatin for elevated LDL. Says she has previously been told she has a genetic condition that affects the thyroid and consequently has "false positive" lipid panels. She has tried cholesterol reducing medications but they all fail. Has been to endocrinology and to lipidology but they were unable to control thyroid or cholesterol levels. Pt interested in seeing a geneticist. Reports feeling well and does not endorse any other symptoms or complaints.    Outpatient Medications Prior to Visit  Medication Sig Dispense Refill  . Levonorgestrel (MIRENA IU) by Intrauterine route.    Marland Kitchen OVER THE COUNTER MEDICATION Take 0.5 tablets by mouth daily. OTC Thyroid Support    . atorvastatin (LIPITOR) 20 MG tablet Take 1 tablet (20 mg total) by mouth daily. (Patient not taking: Reported on 01/04/2018) 90 tablet 3  . naproxen (NAPROSYN) 500 MG tablet Take 1 tablet (500 mg total) by mouth 2 (two) times daily with a meal. (Patient not taking: Reported on 01/04/2018) 30 tablet 0   No facility-administered medications prior to visit.      ROS Review of Systems  Constitutional: Negative for chills, fever and malaise/fatigue.  Eyes: Negative for blurred vision.  Respiratory: Negative for shortness of breath.   Cardiovascular: Negative for chest  pain and palpitations.  Gastrointestinal: Negative for abdominal pain and nausea.  Genitourinary: Negative for dysuria and hematuria.  Musculoskeletal: Positive for joint pain (mild ankle pain ). Negative for myalgias.  Skin: Negative for rash.  Neurological: Negative for tingling and headaches.  Psychiatric/Behavioral: Negative for depression. The patient is not nervous/anxious.     Objective:  BP 128/75 (BP Location: Left Arm, Patient Position: Sitting, Cuff Size: Normal)   Pulse (!) 55   Temp 97.9 F (36.6 C) (Oral)   Resp 18   Ht 4\' 10"  (1.473 m)   Wt 138 lb (62.6 kg)   LMP 05/13/2014   SpO2 99%   BMI 28.84 kg/m   BP/Weight 01/04/2018 06/21/8117 03/19/7827  Systolic BP 562 130 865  Diastolic BP 75 79 73  Wt. (Lbs) 138 135.8 120  BMI 28.84 27.43 24.24      Physical Exam  Constitutional: She is oriented to person, place, and time.  Well developed, well nourished, NAD, polite  HENT:  Head: Normocephalic and atraumatic.  Eyes: No scleral icterus.  Neck: Normal range of motion. Neck supple. No thyromegaly present.  Cardiovascular: Normal rate, regular rhythm and normal heart sounds.  Pulmonary/Chest: Effort normal and breath sounds normal.  Musculoskeletal: She exhibits no edema.  Neurological: She is alert and oriented to person, place, and time.  Normal gait  Skin: Skin is warm and dry. No rash noted. No erythema. No pallor.  Psychiatric: She has a normal mood and affect. Her behavior is normal. Thought content normal.  Vitals reviewed.    Assessment & Plan:  1. Elevated lipoprotein(a) - Thyroid Panel With TSH - Ambulatory referral to Genetics  2. Pain of joint of left ankle and foot - Pt declined to take Naproxen. Says she is feeling much better since reducing the weight she carries in her backpack.   3. Special screening for malignant neoplasms, colon - Fecal occult blood, imunochemical   Follow-up: Return in about 4 weeks (around 02/01/2018).   Clent Demark PA

## 2018-01-04 NOTE — Patient Instructions (Signed)
You should receive a call from Ms. Rolena Infante to enroll you into the [facility] program for free Pap and mammogram.

## 2018-01-05 LAB — THYROID PANEL WITH TSH
Free Thyroxine Index: 0.2 — ABNORMAL LOW (ref 1.2–4.9)
T3 Uptake Ratio: 15 % — ABNORMAL LOW (ref 24–39)
T4, Total: 1.5 ug/dL — CL (ref 4.5–12.0)
TSH: 98.55 u[IU]/mL — AB (ref 0.450–4.500)

## 2018-01-06 ENCOUNTER — Telehealth (HOSPITAL_COMMUNITY): Payer: Self-pay

## 2018-01-06 NOTE — Telephone Encounter (Signed)
Left a message to call [facility] °

## 2018-01-25 ENCOUNTER — Telehealth (HOSPITAL_COMMUNITY): Payer: Self-pay | Admitting: *Deleted

## 2018-01-25 NOTE — Telephone Encounter (Signed)
Telephoned patient's case manager Doug to return a call to [employer]

## 2018-02-01 ENCOUNTER — Ambulatory Visit (INDEPENDENT_AMBULATORY_CARE_PROVIDER_SITE_OTHER): Payer: Non-veteran care | Admitting: Physician Assistant

## 2020-07-11 IMAGING — MR MR HEAD W/O CM
7 of 8 series · 29 of 48 positions shown · non-contrast
Comparison: None.

CLINICAL DATA: Progressive visual loss since 7084. Right arm
tremors. Tourette syndrome.

EXAM:
MRI HEAD WITHOUT CONTRAST
TECHNIQUE: Multiplanar, multiecho pulse sequences of the brain and surrounding
structures were obtained without intravenous contrast.

[Series 3: DWI · axial · 3.0mm · 0.94mm/px · z∈[+6,+142]mm · 9 of 94 slices shown]
[im 1/94]
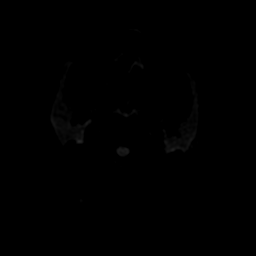
[im 17/94]
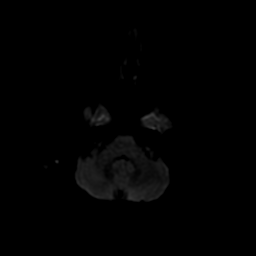
[im 26/94]
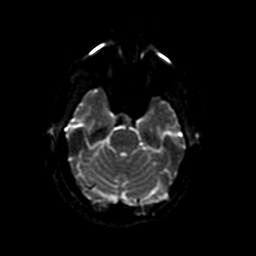
[im 43/94]
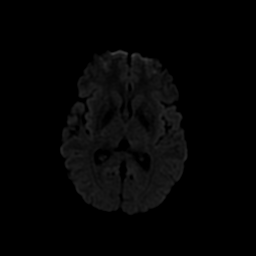
[im 51/94]
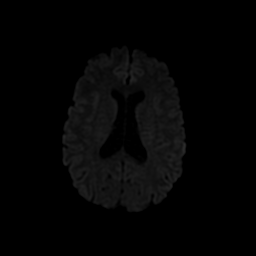
[im 68/94]
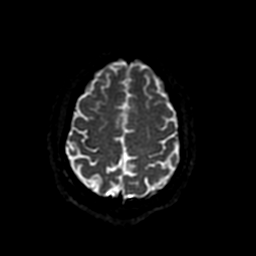
[im 77/94]
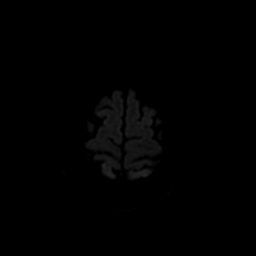
[im 85/94]
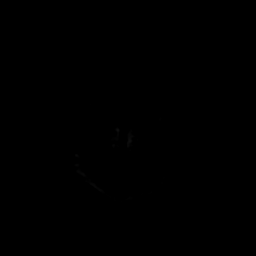
[im 94/94]
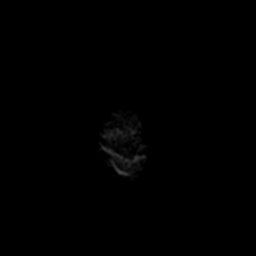

[Series 4: FLAIR · sagittal · 5.0mm · 0.47mm/px · 3 of 23 slices shown (1 of 2)]
[im 1/23]
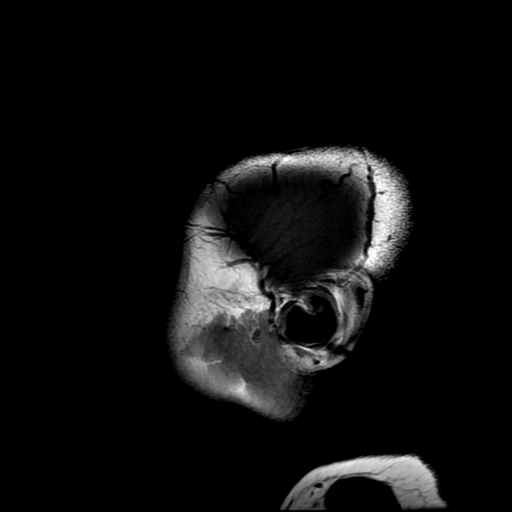
[im 12/23]
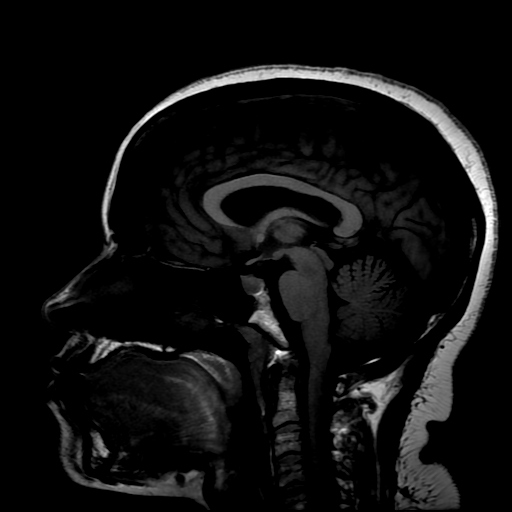
[im 23/23]
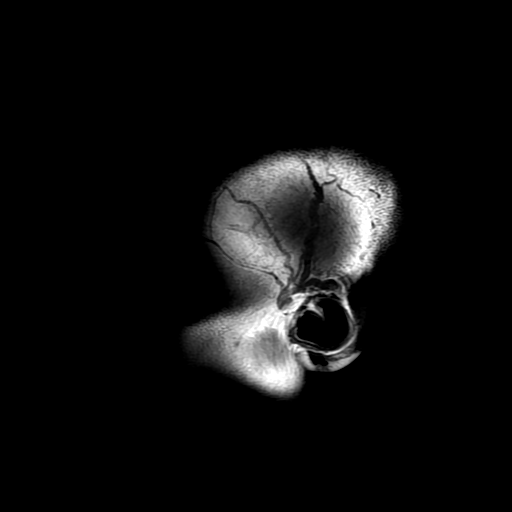

[Series 5: T2 · axial · 5.0mm · 0.47mm/px · z∈[+7,+142]mm · 3 of 24 slices shown (1 of 2)]
[im 1/24]
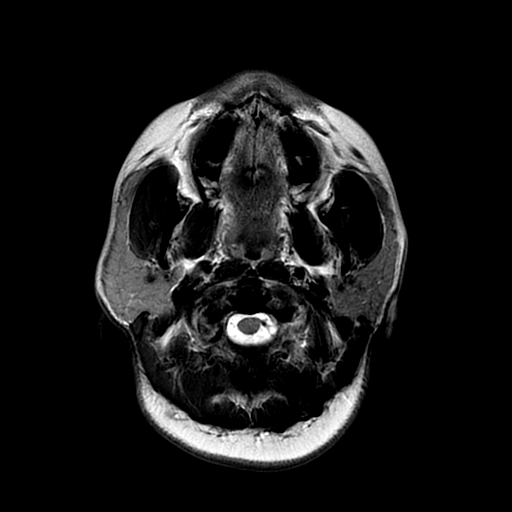
[im 12/24]
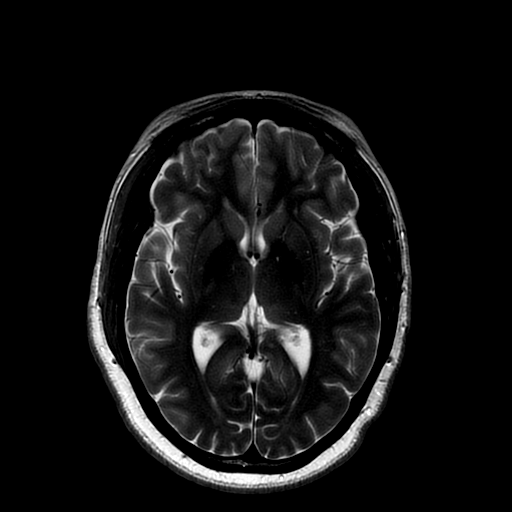
[im 24/24]
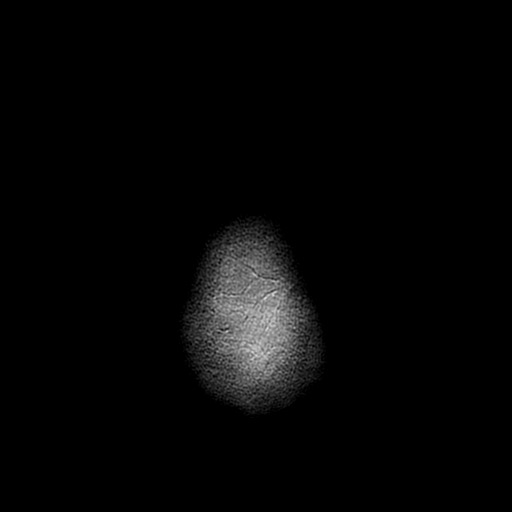

[Series 6: FLAIR · axial · 3.0mm · 0.47mm/px · z∈[+7,+142]mm · 3 of 24 slices shown (2 of 2)]
[im 1/24]
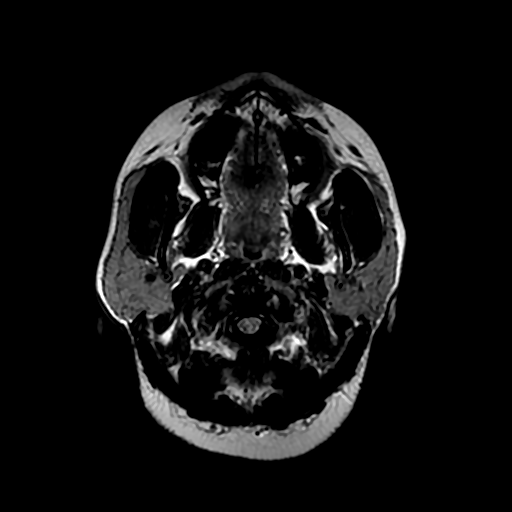
[im 12/24]
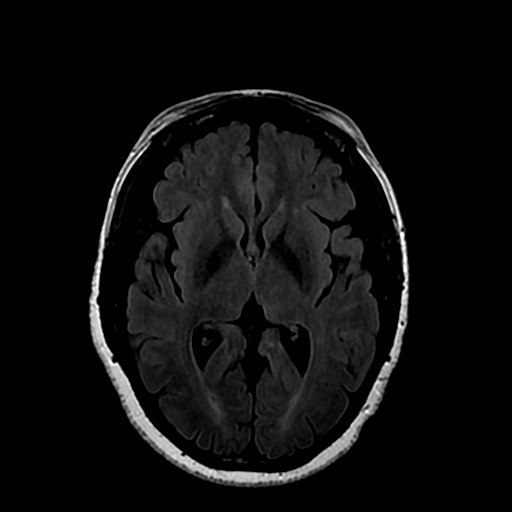
[im 24/24]
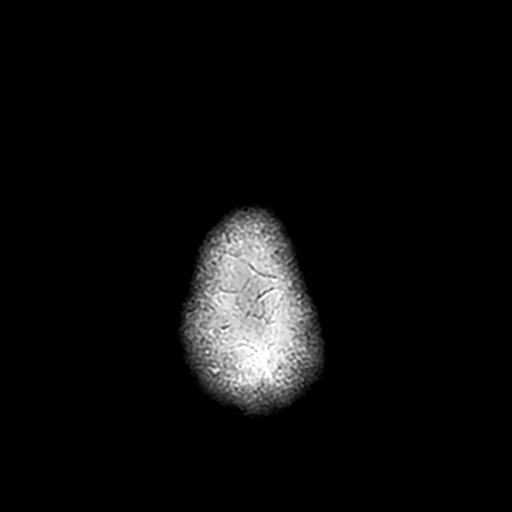

[Series 7: (person_name) · axial · 3.0mm · 0.47mm/px · z∈[+0,+26]mm · 2 of 96 slices shown]
[im 1/96]
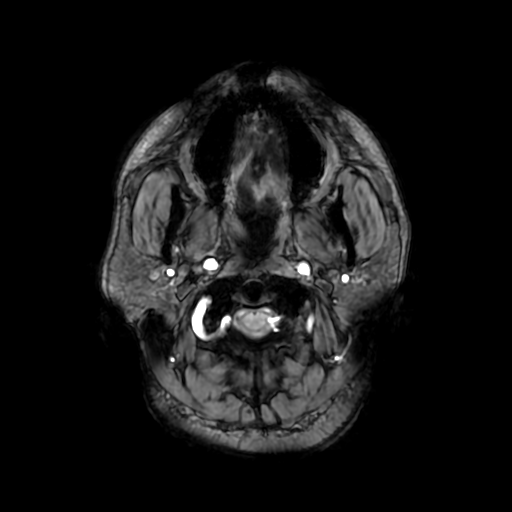
[im 18/96]
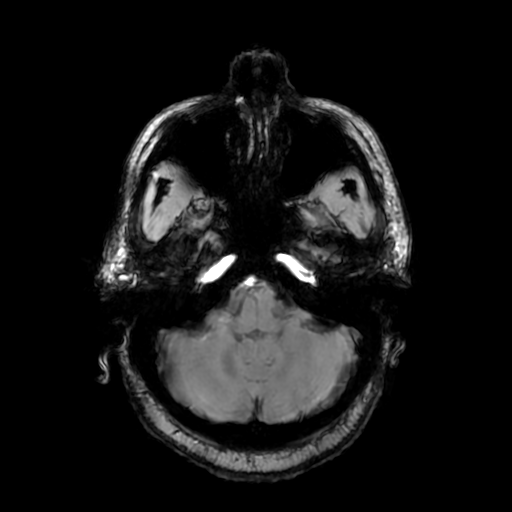

[Series 9: T2 · coronal · 5.0mm · 0.39mm/px · 3 of 27 slices shown (2 of 2)]
[im 1/27]
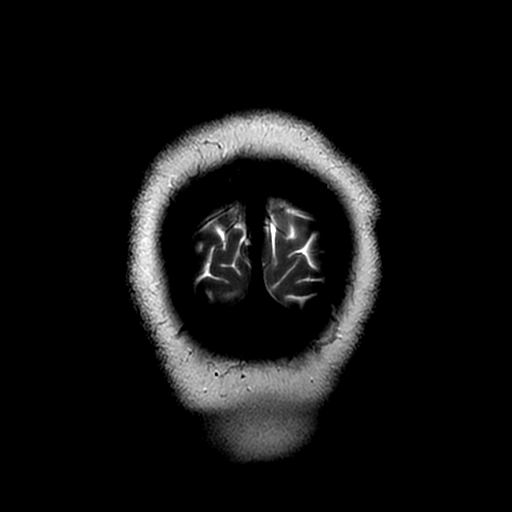
[im 14/27]
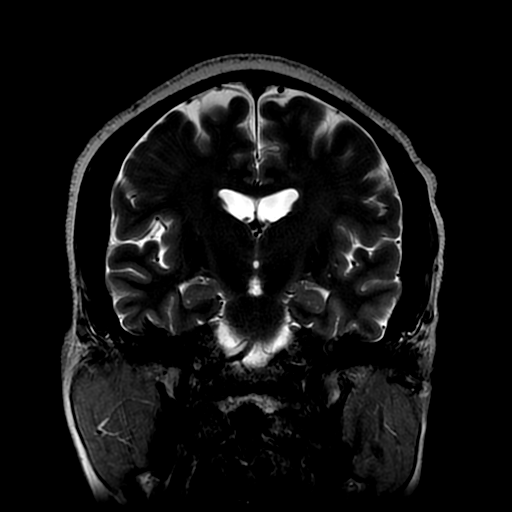
[im 27/27]
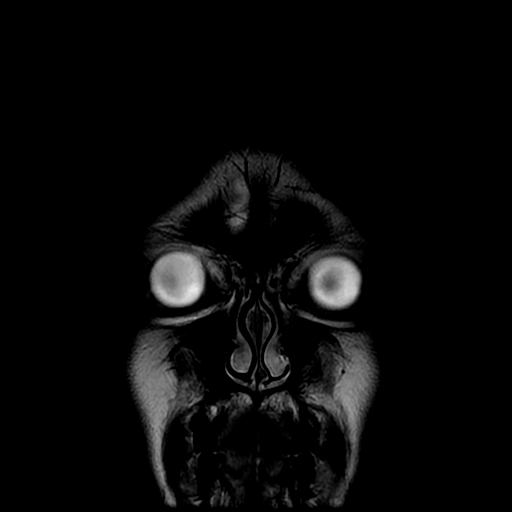

[Series 350: ADC · axial · 3.0mm · 0.94mm/px · z∈[+6,+142]mm · 6 of 46 slices shown]
[im 1/46]
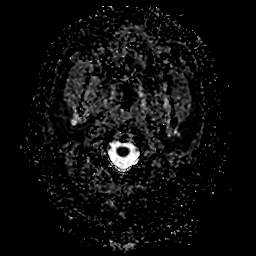
[im 10/46]
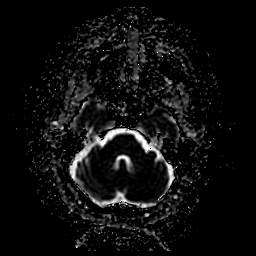
[im 19/46]
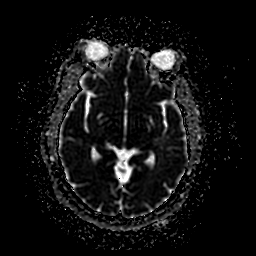
[im 28/46]
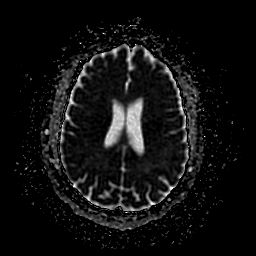
[im 37/46]
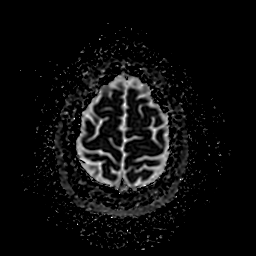
[im 46/46]
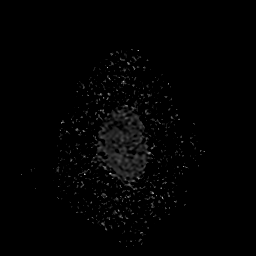

[29 of 48 positions shown; findings below may reference images not displayed]

FINDINGS: Brain: There is no evidence of acute infarct, intracranial
hemorrhage, mass, midline shift, or extra-axial fluid collection.
There is borderline advanced cerebral atrophy for age. No
significant cerebral white matter disease is seen.

The pituitary gland is mildly enlarged measuring 9-10 mm in height
with a focally convex superior margin in the midline. The gland
demonstrates largely uniform T1 and T2 signal intensity on this
nondedicated examination. There is no suprasellar extension.

Vascular: Major intracranial vascular flow voids are preserved.

Skull and upper cervical spine: Unremarkable bone marrow signal.

Sinuses/Orbits: Unremarkable orbits. Paranasal sinuses and mastoid
air cells are clear.

Other: None.
IMPRESSION: 1. No acute intracranial abnormality.
2. Mildly enlarged pituitary gland for age. Correlate for signs of
endocrine dysfunction or causes of pituitary hyperplasia.

## 2023-06-23 ENCOUNTER — Other Ambulatory Visit: Payer: Self-pay

## 2023-06-23 ENCOUNTER — Encounter (HOSPITAL_COMMUNITY): Payer: Self-pay | Admitting: Emergency Medicine

## 2023-06-23 ENCOUNTER — Emergency Department (HOSPITAL_COMMUNITY)

## 2023-06-23 ENCOUNTER — Emergency Department (HOSPITAL_COMMUNITY)
Admission: EM | Admit: 2023-06-23 | Discharge: 2023-06-23 | Disposition: A | Discharge status: Home / Self Care | Attending: Emergency Medicine | Admitting: Emergency Medicine

## 2023-06-23 DIAGNOSIS — M25561 Pain in right knee: Secondary | ICD-10-CM | POA: Insufficient documentation

## 2023-06-23 DIAGNOSIS — X501XXA Overexertion from prolonged static or awkward postures, initial encounter: Secondary | ICD-10-CM | POA: Diagnosis not present

## 2023-06-23 LAB — CBG MONITORING, ED: Glucose-Capillary: 102 mg/dL — ABNORMAL HIGH (ref 70–99)

## 2023-06-23 MED ORDER — ACETAMINOPHEN 500 MG PO TABS
1000.0000 mg | ORAL_TABLET | Freq: Once | ORAL | Status: DC
  Filled 2023-06-23: qty 2

## 2023-06-23 MED ORDER — IBUPROFEN 400 MG PO TABS
600.0000 mg | ORAL_TABLET | Freq: Once | ORAL | Status: DC
  Filled 2023-06-23: qty 1

## 2023-06-23 NOTE — Discharge Instructions (Addendum)
 You were seen here for knee pain.  X-ray was done, did not show any fractures.  You can take Tylenol and ibuprofen as needed for pain.  Please follow-up with orthopedic surgery outpatient for reassessment.  Please return to the ER for any emergency medical care.

## 2023-06-23 NOTE — Progress Notes (Signed)
 Orthopedic Tech Progress Note Patient Details:  Carol Dickson [birth year] [id number] Applied knee sleeve per order.  Ortho Devices Type of Ortho Device: Knee Sleeve Ortho Device/Splint Location: RLE Ortho Device/Splint Interventions: Ordered, Application, Adjustment   Post Interventions Patient Tolerated: Well Instructions Provided: Adjustment of device, Care of device, Poper ambulation with device  Blase Mess 06/23/2023, 9:58 PM

## 2023-06-23 NOTE — ED Triage Notes (Signed)
 Pt reports swelling to right knee and remembers hitting it on a rock Tuesday or Wednesday. Also pain to right index finger. Rash to neck 2 weeks ago.

## 2023-06-23 NOTE — ED Provider Notes (Addendum)
 [place] [facility] Provider Note   CSN: [id number] Arrival date & time: 06/23/23  1753     History  Chief Complaint  Patient presents with   Knee Pain    Carol Dickson is a 58 y.o. female with no significant past medical history presents with right knee pain.  Patient states that she thinks she may have hit her knee on Tuesday against a rock, but also is unsure if she may have twisted the knee.  She is been able to ambulate with pain but did notice swelling today.  Denies any fevers or IV drug use.  Denies any history of diabetes.  She also mentions to me her right index finger broke over a year ago and was wondering if there is anything else to do for it.  She also notes a circular rash to the left side of her neck that has been present for 2 weeks.  Has not taken any antibiotics.   Knee Pain      Home Medications Prior to Admission medications   Medication Sig Start Date End Date Taking? Authorizing Provider  Multiple Vitamin (MULTIVITAMIN WITH MINERALS) TABS tablet Take 1 tablet by mouth daily.   Yes [provider]      Allergies    Egg yolk    Review of Systems   Review of Systems  Physical Exam Updated Vital Signs BP 122/76 (BP Location: Left Arm)   Pulse (!) 53   Temp 98.2 F (36.8 C)   Resp 18   Ht 4\' 11"  (1.499 m)   Wt 46.3 kg   LMP 05/13/2014   SpO2 100%   BMI 20.60 kg/m  Physical Exam Vitals and nursing note reviewed.  Constitutional:      General: She is not in acute distress.    Appearance: She is well-developed.  HENT:     Head: Normocephalic and atraumatic.  Eyes:     Conjunctiva/sclera: Conjunctivae normal.  Cardiovascular:     Rate and Rhythm: Normal rate and regular rhythm.     Heart sounds: No murmur heard. Pulmonary:     Effort: Pulmonary effort is normal. No respiratory distress.     Breath sounds: Normal breath sounds.  Abdominal:     General: There is no distension.     Palpations:  Abdomen is soft.     Tenderness: There is no abdominal tenderness.  Musculoskeletal:        General: No swelling.     Cervical back: Neck supple.     Comments: Right knee swelling noted to the proximal portion of the knee with no overlying skin changes or warmth on palpation.  She is able to flex and extend her knee, with pain elicited on significant flexion.  Knee is stable with negative anterior and posterior drawer sign, stable on valgus and varus maneuver  Skin:    General: Skin is warm and dry.     Capillary Refill: Capillary refill takes less than 2 seconds.  Neurological:     Mental Status: She is alert.  Psychiatric:        Mood and Affect: Mood normal.     ED Results / Procedures / Treatments   Labs (all labs ordered are listed, but only abnormal results are displayed) Labs Reviewed  CBG MONITORING, ED - Abnormal; Notable for the following components:      Result Value   Glucose-Capillary 102 (*)    All other components within normal limits  EKG None  Radiology DG Knee Complete 4 Views Right Result Date: 06/23/2023 CLINICAL DATA:  Pain in right side of knee after hitting rock. EXAM: RIGHT KNEE - COMPLETE 4+ VIEW COMPARISON:  04/18/2013. FINDINGS: No evidence of acute fracture or dislocation. There is a small to moderate suprapatellar joint effusion. Mild degenerative changes are noted in the patellofemoral compartment. Soft tissues are unremarkable. IMPRESSION: 1. No acute fracture or dislocation. 2. Mild degenerative changes in the patellofemoral compartment. 3. Small to moderate suprapatellar joint effusion. Electronically Signed   By: Wyvonnia Heimlich M.D.   On: 06/23/2023 21:30    Procedures Procedures    Medications Ordered in ED Medications  acetaminophen  (TYLENOL ) tablet 1,000 mg (1,000 mg Oral Not Given 06/23/23 2045)  ibuprofen  (ADVIL ) tablet 600 mg (600 mg Oral Not Given 06/23/23 2045)    ED Course/ Medical Decision Making/ A&P                                  Medical Decision Making Amount and/or Complexity of Data Reviewed Radiology: ordered.  Risk OTC drugs.   Patient is alert, afebrile, and hemodynamically stable in the ED in no acute distress.  Physical exam as noted above, with right knee swelling noted to the proximal portion of the knee with no overlying skin changes or warmth on palpation.  She is able to flex and extend her knee (reassuring against quad tendon rupture), with pain elicited on significant flexion.  Knee is stable with negative anterior and posterior drawer sign, stable on valgus and varus maneuver.  Will obtain x-ray to assess for any fractures or malalignments.  Otherwise, I am reassured against ACL or PCL injury.  She was given Tylenol  Motrin .  In terms of patient's index finger, it appears to have intact flexion extension of the DIP and PIP with no obvious deformity and good capillary refill.  NTD from an acute standpoint, especially given that injury occurred over a year ago.  Rash noted to the left side of the neck appears eczematous in nature, dry and slightly raised with no erythema, no target lesion, no skin sloughing, no purulence.  Discussed Vaseline or lotion to the site.  X-ray resulted, which I personally interpreted, and demonstrated no fractures, no malalignments but swelling noted in the suprapatellar region.  Patient may have small ligament or meniscal injury.  Discussed wearing knee brace and following up with orthopedic surgery outpatient.  She is agreeable to this plan.  Discussed taking Tylenol  and Motrin  as needed for pain.  Strict return precautions are given, patient was discharged in stable condition.        Final Clinical Impression(s) / ED Diagnoses Final diagnoses:  Acute pain of right knee    Rx / DC Orders ED Discharge Orders     None         Lorain Robson, MD 06/23/23 2143    Lorain Robson, MD 06/23/23 2145    Dorenda Gandy, MD 07/04/23 [phone]

## 2024-04-05 ENCOUNTER — Emergency Department (HOSPITAL_COMMUNITY)
Admission: EM | Admit: 2024-04-05 | Discharge: 2024-04-05 | Disposition: A | Discharge status: Home / Self Care | Attending: Emergency Medicine | Admitting: Emergency Medicine

## 2024-04-05 ENCOUNTER — Emergency Department (HOSPITAL_COMMUNITY)

## 2024-04-05 ENCOUNTER — Other Ambulatory Visit: Payer: Self-pay

## 2024-04-05 ENCOUNTER — Encounter (HOSPITAL_COMMUNITY): Payer: Self-pay

## 2024-04-05 DIAGNOSIS — L853 Xerosis cutis: Secondary | ICD-10-CM | POA: Insufficient documentation

## 2024-04-05 DIAGNOSIS — I2510 Atherosclerotic heart disease of native coronary artery without angina pectoris: Secondary | ICD-10-CM | POA: Insufficient documentation

## 2024-04-05 DIAGNOSIS — L03818 Cellulitis of other sites: Secondary | ICD-10-CM

## 2024-04-05 DIAGNOSIS — L03115 Cellulitis of right lower limb: Secondary | ICD-10-CM | POA: Insufficient documentation

## 2024-04-05 DIAGNOSIS — L03116 Cellulitis of left lower limb: Secondary | ICD-10-CM | POA: Insufficient documentation

## 2024-04-05 LAB — COMPREHENSIVE METABOLIC PANEL WITH GFR
ALT: 19 U/L (ref 0–44)
AST: 28 U/L (ref 15–41)
Albumin: 4.5 g/dL (ref 3.5–5.0)
Alkaline Phosphatase: 83 U/L (ref 38–126)
Anion gap: 9 (ref 5–15)
BUN: 16 mg/dL (ref 6–20)
CO2: 31 mmol/L (ref 22–32)
Calcium: 10.3 mg/dL (ref 8.9–10.3)
Chloride: 102 mmol/L (ref 98–111)
Creatinine, Ser: 1.04 mg/dL — ABNORMAL HIGH (ref 0.44–1.00)
GFR, Estimated: >60 mL/min
Glucose, Bld: 80 mg/dL (ref 70–99)
Potassium: 4.4 mmol/L (ref 3.5–5.1)
Sodium: 142 mmol/L (ref 135–145)
Total Bilirubin: 0.4 mg/dL (ref 0.0–1.2)
Total Protein: 7.4 g/dL (ref 6.5–8.1)

## 2024-04-05 LAB — CBC WITH DIFFERENTIAL/PLATELET
Basophils Absolute: 0 K/uL (ref 0.0–0.1)
Basophils Relative: 0 %
Eosinophils Absolute: 0 K/uL (ref 0.0–0.5)
Eosinophils Relative: 0 %
HCT: 34.7 % — ABNORMAL LOW (ref 36.0–46.0)
Hemoglobin: 10.9 g/dL — ABNORMAL LOW (ref 12.0–15.0)
Lymphocytes Relative: 35 %
Lymphs Abs: 1.5 K/uL (ref 0.7–4.0)
MCH: 32.1 pg (ref 26.0–34.0)
MCHC: 31.4 g/dL (ref 30.0–36.0)
MCV: 102.1 fL — ABNORMAL HIGH (ref 80.0–100.0)
Monocytes Absolute: 0.3 K/uL (ref 0.1–1.0)
Monocytes Relative: 7 %
Neutro Abs: 2.5 K/uL (ref 1.7–7.7)
Neutrophils Relative %: 58 %
Platelets: 161 K/uL (ref 150–400)
RBC: 3.4 MIL/uL — ABNORMAL LOW (ref 3.87–5.11)
RDW: 13.2 % (ref 11.5–15.5)
WBC: 4.3 K/uL (ref 4.0–10.5)
nRBC: 0 % (ref 0.0–0.2)

## 2024-04-05 LAB — C-REACTIVE PROTEIN: CRP: <0.5 mg/dL

## 2024-04-05 LAB — SEDIMENTATION RATE: Sed Rate: 9 mm/h (ref 0–22)

## 2024-04-05 MED ORDER — CEPHALEXIN 500 MG PO CAPS: 500.0000 mg | ORAL_CAPSULE | Freq: Three times a day (TID) | ORAL | 0 refills | Status: DC

## 2024-04-05 MED ORDER — CEPHALEXIN 250 MG PO CAPS
500.0000 mg | ORAL_CAPSULE | Freq: Once | ORAL | Status: AC
  Administered 2024-04-05: 500 mg via ORAL
  Filled 2024-04-05: qty 2

## 2024-04-05 MED ORDER — CEPHALEXIN 500 MG PO CAPS: 500.0000 mg | ORAL_CAPSULE | Freq: Three times a day (TID) | ORAL | 0 refills | Status: AC

## 2024-04-05 NOTE — ED Provider Notes (Signed)
 " [place] [facility] Provider Note   CSN: [id number] Arrival date & time: 04/05/24  1508     History Chief Complaint  Patient presents with   Bil Foot pain    HPI: Carol Dickson is a 59 y.o. female with history pertinent for undomiciled status, CAD, hypercholesterolemia, Tourette's syndrome who presents complaining of foot discomfort. Patient arrived via EMS.  History provided by patient.  No interpreter required during this encounter.  Patient reports that she has had foot pain, dry skin, swelling for approximately 1 year as well as discoloration of her toenails.  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  Reports that she has what she believes to be a wound that is not healing well on her left heel that has previously required debridement.  Denies fevers, chills.  Reports that she feels that her left second toe and toenail are black, and this is indicative of a severe infection.  Patient's recorded medical, surgical, social, medication list and allergies were reviewed in the Snapshot window as part of the initial history.   Prior to Admission medications  Medication Sig Start Date End Date Taking? Authorizing Provider  cephALEXin  (KEFLEX ) 500 MG capsule Take 1 capsule (500 mg total) by mouth 3 (three) times daily. 04/05/24   [name], MD  Multiple Vitamin (MULTIVITAMIN WITH MINERALS) TABS tablet Take 1 tablet by mouth daily.    [provider]     Allergies: Egg yolk   Review of Systems   ROS as per HPI  Physical Exam Updated Vital Signs BP 119/78 (BP Location: Left Arm)   Pulse (!) 52   Temp 99.1 F (37.3 C) (Oral)   Resp 20   Ht 4' 11 (1.499 m)   Wt 47 kg   LMP 05/13/2014   SpO2 100%   BMI 20.93 kg/m  Physical Exam Vitals and nursing note reviewed.  Constitutional:      General: She is not in acute  distress.    Appearance: She is well-developed.  HENT:     Head: Normocephalic and atraumatic.  Eyes:     Conjunctiva/sclera: Conjunctivae normal.  Cardiovascular:     Rate and Rhythm: Normal rate and regular rhythm.     Heart sounds: No murmur heard. Pulmonary:     Effort: Pulmonary effort is normal. No respiratory distress.     Breath sounds: Normal breath sounds.  Abdominal:     Palpations: Abdomen is soft.  Musculoskeletal:        General: No swelling.     Cervical back: Neck supple.  Skin:    General: Skin is warm and dry.     Capillary Refill: Capillary refill takes less than 2 seconds.  Neurological:     Mental Status: She is alert.  Psychiatric:        Mood and Affect: Mood normal.          ED Course/ Medical Decision Making/ A&P    Procedures Procedures   Medications Ordered in ED Medications  cephALEXin  (KEFLEX ) capsule 500 mg (500 mg Oral Given 04/05/24 2053)    Medical Decision Making:   Carol Dickson is a 59 y.o. female who presents for foot concerns as per above.  Physical exam is pertinent for dry skin of bilateral feet with mild diffuse associated swelling, there is no point tenderness.  Patient does have dry cracked  skin and fissure to the posterior aspect of left heel, as well as slight hyperpigmentation of the left second toe, as well as slight hyperpigmentation of a plantar aspect of the left foot approximately 2 mm x 2 mm, there is no appreciable open wound.  See images..   The differential includes but is not limited to cellulitis, abscess, osteomyelitis, diabetic foot wound.  Independent historian: None  External data reviewed: No pertinent external data  Initial Plan:  Screening labs including CBC and Metabolic panel to evaluate for infectious or metabolic etiology of disease.  ESR and CRP to further evaluate for underlying inflammatory etiology such as osteomyelitis, cellulitis, abscess X-ray of bilateral feet to evaluate for evidence of  osteomyelitis, fracture, dislocation Objective evaluation as below reviewed   Labs: Ordered, Independent interpretation, and Details: ESR and CRP WNL.  CMP without AKI, emergent electrolyte derangement, emergent LFT abnormality.  CBC without leukocytosis or thrombocytopenia, mild stable anemia on comparison to prior.  Radiology: Ordered, Independent interpretation, Details: I do not appreciate displaced fracture or dislocation of bilateral feet., and All images reviewed independently.  Agree with radiology report at this time.   DG Foot 2 Views Right Result Date: 04/05/2024 EXAM: 1 OR 2 VIEW(S) XRAY OF THE LATERALITY FOOT 04/05/2024 05:46:00 PM COMPARISON: None available. CLINICAL HISTORY: Foot pain. FINDINGS: BONES AND JOINTS: No acute fracture. Small plantar calcaneal spur. Mild hallux valgus deformity. SOFT TISSUES: Unremarkable. IMPRESSION: 1. No acute findings. 2. Mild hallux valgus deformity. Electronically signed by: Greig Pique MD 04/05/2024 05:56 PM EST RP Workstation: [id number]   DG Foot 2 Views Left Result Date: 04/05/2024 EXAM: 2 VIEW(S) XRAY OF THE LEFT FOOT 04/05/2024 05:46:00 PM COMPARISON: None available. CLINICAL HISTORY: Foot pain. FINDINGS: BONES AND JOINTS: No acute fracture. No malalignment. SOFT TISSUES: Unremarkable. IMPRESSION: 1. No acute findings. Electronically signed by: Greig Pique MD 04/05/2024 05:55 PM EST RP Workstation: [id number]    EKG/Medicine tests: Not indicated EKG Interpretation:    Interventions: Keflex   See the EMR for full details regarding lab and imaging results.  Patient presents for dry feet and concern for fungal infection of her toenails.  Patient does not have overt yellowing or thickening of her toenails concerning for fungal infection.  Patient does have slight hyperpigmentation of the left second toe compared to the remainder of the toes, potentially concerning for an early developing cellulitis, patient is vitally stable, well-appearing,  patient is concern for wound to the posterior portion of her foot, she does have thickened dry skin as well as fissures within this thickened dry skin, however there is no open wound, induration, evidence of cellulitis, abscess.  X-rays obtained and do not reveal underlying bony abnormality or osteomyelitis.  Labs obtained and are reassuring, no leukocytosis, inflammatory markers not elevated, therefore low index of suspicion for severe inflammatory/infectious etiology of symptoms.  Small early developing cellulitis of the left second toe, will treat with course of Keflex , first dose ordered in the ED discussed with patient that physical exam and workup is reassuring against emergent pathology.  Discussed that she should continue to moisturize her feet, and she can follow-up with dermatology and podiatry, patient request that these referrals be sent to the [facility], and asked to be transported to the [facility].  Discussed that her presentation is reassuring, therefore she does not require admission at this time, however given she could have a early developing cellulitis of the left second toe we will prescribe Keflex .  Patient becomes agitated, stating that her left second toe and  her toenail are black, discussed that there is slight hyperpigmentation of this toe relative to the others, however there is no evidence of gangrene or severe infection at this time, workup is reassuring.  Patient becomes further agitated, states that she requires debridement of her bilateral feet under anesthesia, discussed that there is no indication on her exam or workup that this is indicated at this time, however we are happy to provide referrals to the [facility].  Patient expresses displeasure however expresses understanding.  Of note, patient was able to ambulate without assistance in order to leave the ED.  Presentation is most consistent with acute complicated illness and Current presentation is complicated by underlying chronic  conditions  Discussion of management or test interpretations with external provider(s): Not indicated  Risk Drugs:Prescription drug management  Disposition: DISCHARGE: I believe that the patient is safe for discharge home with outpatient follow-up. Patient was informed of all pertinent physical exam, laboratory, and imaging findings. Patient's suspected etiology of their symptom presentation was discussed with the patient and all questions were answered. We discussed following up with PCP, podiatry, dermatology. I provided thorough ED return precautions. The patient feels safe and comfortable with this plan.  MDM generated using voice dictation software and may contain dictation errors.  Please contact me for any clarification or with any questions.  Clinical Impression:  1. Cellulitis of other specified site   2. Dry skin      Discharge   Final Clinical Impression(s) / ED Diagnoses Final diagnoses:  Cellulitis of other specified site  Dry skin    Rx / DC Orders ED Discharge Orders          Ordered    cephALEXin  (KEFLEX ) 500 MG capsule  3 times daily,   Status:  Discontinued        04/05/24 2050    cephALEXin  (KEFLEX ) 500 MG capsule  3 times daily        04/05/24 2052    Ambulatory referral to Dermatology       Comments: Needs referral to dermatology at the [facility] office in [place]   04/05/24 2054    Ambulatory referral to Podiatry       Comments: Needs referral to podiatry at the [facility] office in [place]   04/05/24 2054             [name], MD 04/05/24 2107  "

## 2024-04-05 NOTE — ED Provider Triage Note (Signed)
 Emergency Medicine Provider Triage Evaluation Note  Carol Dickson , a 59 y.o. female  was evaluated in triage.  Pt complains of bilateral foot pain.  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  Reports that she has what she believes to be a wound that is not healing well on her left heel that has previously required debridement..         Review of Systems  Positive: Per above Negative: Per above  Physical Exam  BP 101/63 (BP Location: Left Arm)   Pulse (!) 58   Temp 98.8 F (37.1 C) (Oral)   Resp 17   Ht 4' 11 (1.499 m)   Wt 47 kg   LMP 05/13/2014   SpO2 99%   BMI 20.93 kg/m  Gen:   Awake, no distress   Resp:  Normal effort  MSK:   Moves extremities without difficulty  Other:  Per above with dry skin.  Medical Decision Making  Medically screening exam initiated at 5:13 PM.  Appropriate orders placed.  Carol Dickson was informed that the remainder of the evaluation will be completed by another provider, this initial triage assessment does not replace that evaluation, and the importance of remaining in the ED until their evaluation is complete.   Carol Jerilynn RAMAN, MD 04/05/24 [phone]

## 2024-04-05 NOTE — ED Triage Notes (Signed)
 Pt BIB [facility] from [facility] where [facility] was trying to seek [facility] & they report they cannot help her so they called EMS. Does have bil foot pain d/t known fungus under her feet, dead skin that has been debrided before & may need it again, swelling in both feet & a wound that will not heal on the back of her Lt foot heel. Pt reports has been compliant with her meds but it is not getting better. A/Ox4, VSS.

## 2024-04-05 NOTE — ED Notes (Signed)
Pt given bus pass prior to discharge.

## 2024-04-05 NOTE — ED Notes (Signed)
 Pt argumentative w/ provider about treatment plan

## 2024-04-05 NOTE — Discharge Instructions (Addendum)
 Carol Dickson  Thank you for allowing us  to take care of you today.  You came to the Emergency Department today because you have dry skin of your feet.  You are concerned about a fungal infection of your toenails.  You do have some slight discoloration of your left second toe, which could be seen in a early soft tissue infection such as cellulitis, however you do not have any severe changes, underlying abnormalities on your x-ray, fever, elevation of your white blood cell count, nor elevation of your inflammatory markers.  You do not have any severe infection that requires IV antibiotics.  We will prescribe you Keflex  which you can take by mouth 3 times a day for the next 5 days.  You can follow-up with your primary care doctor, as well as the chest and dermatologist at the [facility]  You can use Tylenol  and ibuprofen  every 4-6 hours as needed for pain control.  You can take these medications together for maximum pain control, or you can alternate between the 2 for having medication more frequently, for example Tylenol  at noon, ibuprofen  at 3 PM, Tylenol  at 6 PM, ibuprofen  at 9 PM, etc.  For adults, the dosing is 600 mg of ibuprofen , and 500 mg of Tylenol .  Please do not exceed 4 g of Tylenol  within 24 hours.  Please do not exceed 3200 mg ibuprofen  24 hours.     To-Do: 1. Please follow-up with your primary doctor within 1 - 2 weeks / as soon as possible.   Please return to the Emergency Department or call 911 if you experience have worsening of your symptoms, chest pain, shortness of breath, severe or significantly worsening pain, high fever, severe confusion, pass out or have any reason to think that you need emergency medical care.   We hope you feel better soon.   Department of Emergency Medicine [facility]
